# Patient Record
Sex: Female | Born: 1980
Health system: Southern US, Community
[De-identification: ages and names within clinical notes are randomized; demographics above are authoritative.]

## PROBLEM LIST (undated history)

## (undated) DIAGNOSIS — D649 Anemia, unspecified: Secondary | ICD-10-CM

## (undated) DIAGNOSIS — D259 Leiomyoma of uterus, unspecified: Secondary | ICD-10-CM

## (undated) HISTORY — PX: TUBAL LIGATION: SHX77

## (undated) HISTORY — DX: Anemia, unspecified: D64.9

---

## 2001-04-12 ENCOUNTER — Inpatient Hospital Stay (HOSPITAL_COMMUNITY): Admission: AD | Admit: 2001-04-12 | Discharge: 2001-04-12 | Payer: Self-pay | Admitting: Obstetrics & Gynecology

## 2007-03-09 ENCOUNTER — Other Ambulatory Visit: Admission: RE | Admit: 2007-03-09 | Discharge: 2007-03-09 | Payer: Self-pay | Admitting: Obstetrics & Gynecology

## 2007-05-06 ENCOUNTER — Ambulatory Visit (HOSPITAL_COMMUNITY): Admission: RE | Admit: 2007-05-06 | Discharge: 2007-05-06 | Payer: Self-pay | Admitting: Preventative Medicine

## 2008-07-25 ENCOUNTER — Ambulatory Visit (HOSPITAL_COMMUNITY): Admission: RE | Admit: 2008-07-25 | Discharge: 2008-07-25 | Payer: Self-pay | Admitting: Obstetrics & Gynecology

## 2008-08-04 ENCOUNTER — Other Ambulatory Visit: Admission: RE | Admit: 2008-08-04 | Discharge: 2008-08-04 | Payer: Self-pay | Admitting: Obstetrics & Gynecology

## 2008-08-22 ENCOUNTER — Ambulatory Visit (HOSPITAL_COMMUNITY): Admission: RE | Admit: 2008-08-22 | Discharge: 2008-08-22 | Payer: Self-pay | Admitting: Obstetrics & Gynecology

## 2008-10-24 ENCOUNTER — Ambulatory Visit (HOSPITAL_COMMUNITY): Admission: RE | Admit: 2008-10-24 | Discharge: 2008-10-24 | Payer: Self-pay | Admitting: Obstetrics & Gynecology

## 2008-11-01 ENCOUNTER — Ambulatory Visit (HOSPITAL_COMMUNITY): Admission: RE | Admit: 2008-11-01 | Discharge: 2008-11-01 | Payer: Self-pay | Admitting: Obstetrics & Gynecology

## 2008-11-09 ENCOUNTER — Ambulatory Visit (HOSPITAL_COMMUNITY): Admission: RE | Admit: 2008-11-09 | Discharge: 2008-11-09 | Payer: Self-pay | Admitting: Obstetrics & Gynecology

## 2008-12-13 ENCOUNTER — Inpatient Hospital Stay (HOSPITAL_COMMUNITY): Admission: AD | Admit: 2008-12-13 | Discharge: 2008-12-16 | Payer: Self-pay | Admitting: Obstetrics & Gynecology

## 2008-12-13 ENCOUNTER — Ambulatory Visit: Payer: Self-pay | Admitting: Obstetrics and Gynecology

## 2009-09-26 ENCOUNTER — Other Ambulatory Visit: Admission: RE | Admit: 2009-09-26 | Discharge: 2009-09-26 | Payer: Self-pay | Admitting: Obstetrics and Gynecology

## 2009-10-31 ENCOUNTER — Ambulatory Visit (HOSPITAL_COMMUNITY): Admission: RE | Admit: 2009-10-31 | Discharge: 2009-10-31 | Payer: Self-pay | Admitting: General Surgery

## 2010-06-23 ENCOUNTER — Encounter: Payer: Self-pay | Admitting: Obstetrics & Gynecology

## 2010-06-24 ENCOUNTER — Encounter: Payer: Self-pay | Admitting: Obstetrics & Gynecology

## 2010-08-19 LAB — BASIC METABOLIC PANEL
BUN: 13 mg/dL (ref 6–23)
Calcium: 9.4 mg/dL (ref 8.4–10.5)
Chloride: 105 mEq/L (ref 96–112)
Glucose, Bld: 105 mg/dL — ABNORMAL HIGH (ref 70–99)

## 2010-08-19 LAB — CBC
HCT: 34 % — ABNORMAL LOW (ref 36.0–46.0)
Hemoglobin: 10.7 g/dL — ABNORMAL LOW (ref 12.0–15.0)

## 2010-09-08 LAB — CBC
HCT: 29.3 % — ABNORMAL LOW (ref 36.0–46.0)
HCT: 33.8 % — ABNORMAL LOW (ref 36.0–46.0)
MCHC: 33.7 g/dL (ref 30.0–36.0)
MCHC: 33.2 g/dL (ref 30.0–36.0)
MCV: 79.5 fL (ref 78.0–100.0)
MCV: 77.9 fL — ABNORMAL LOW (ref 78.0–100.0)
Platelets: 205 10*3/uL (ref 150–400)
RBC: 4.35 MIL/uL (ref 3.87–5.11)
RDW: 15 % (ref 11.5–15.5)
WBC: 13.4 10*3/uL — ABNORMAL HIGH (ref 4.0–10.5)

## 2010-09-08 LAB — GLUCOSE, CAPILLARY: Glucose-Capillary: 114 mg/dL — ABNORMAL HIGH (ref 70–99)

## 2010-10-15 NOTE — H&P (Signed)
[name], [name] NO.:  [id number]   MEDICAL RECORD NO.:  [id number]          PATIENT TYPE:  INP   LOCATION:  9162                          FACILITY:  [facility]   PHYSICIAN:  Tilda Burrow, M.D. DATE OF BIRTH:  [birth year]   DATE OF ADMISSION:  12/13/2008  DATE OF DISCHARGE:                              HISTORY & PHYSICAL   ADMITTING DIAGNOSES:  Pregnancy 40 weeks 5 days, induction of labor,  borderline low normal amniotic fluid index 9.8, history of intrauterine  growth retardation infant with previous pregnancy, cervical friability,  history of rapid labor.   HISTORY OF PRESENT ILLNESS:  This 30 year old female gravida 3, para 1-0-  1-1, LMP April 02, 2008, placing menstrual EDC, January 07, 2009, with  ultrasound corrected [facility] of December 08, 2008, based on 18-week ultrasound  with progressively large infant by serial ultrasounds.  Infant has had  estimated weight of 4073 g on December 05, 2008, based on ultrasound at  [facility] with AFI of 9.17.  Cervix has been found to be  favorable at 2 cm, 50%, -2 at last office visit, December 08, 2008, and was  seen by Dr. Despina Hidden.  She is scheduled for induction of labor at this time.  Prenatal course has been followed through [facility] since  July 13, 2008, with pregnancy notable for blood type O+, urine drug  screen negative, hemoglobin 10, hematocrit 32, platelets 273,000.  Hepatitis, HIV, RPR, GC, and chlamydia all negative.  Pap smear class I.  She has a history of abnormal Paps in the past.  MSAFP was abnormal with  increased risk of open spina bifida at 1 and 14.  She has had two  ultrasounds at [facility], which showed no visible  abnormalities.  Amniocentesis was not performed.  Femur length was at  the 7th percentile and serial ultrasounds to rule out IUGR where  recommended and followed.  The infant is now large for gestational age  of 32 g.  Glucose tolerance test was 150 mg  percent at 28 weeks with 3-  hour glucose tolerance test grossly normal.  She had no episodes of  glucosuria and was not monitoring blood sugars.  She desires postpartum  contraception, tubal ligation, and desires in-house tubal ligation if  possible.  She plans to breast-feeding with bottle supplementation, and  we will take the baby to Dr. Phillips Odor.   PAST MEDICAL HISTORY:  Benign.   SURGICAL HISTORY:  Nose surgery in 1998, breast biopsy in 2002, dental  surgery in 2000.  Cigarettes, alcohol, or recreational drugs are denied.   ALLERGIES:  She has no allergies.   FAMILY HISTORY:  Positive for hypertension, diabetes, stroke.   GYN HISTORY:  Notable for abnormal Pap smear.   FAMILY GENETIC HISTORY:  Positive for a maternal aunt's first daughter  who was reportedly born without limbs.  She had a maternal uncle whose  family involved in an anencephalic child.  As stated earlier, the  ultrasounds on this infant have been normal [facility].   PHYSICAL EXAMINATION:  Reveals a healthy-appearing female, weight  175,  blood pressure 120/70, fundal height 39 cm, estimated fetal weight by  ultrasound 4073 g.  Cervix 2 cm, 50%, -2 on December 08, 2008.  Group B strep  is negative.  Repeat GC and chlamydia negative.   ASSESSMENT:  Pregnancy 40 weeks 7 days, cervical friability, large for  gestational age infant, history of prior intrauterine growth retardation  infant scheduled for induction of labor December 13, 2008.      Tilda Burrow, M.D.  Electronically Signed     JVF/MEDQ  D:  12/13/2008  T:  12/14/2008  Job:  [id number]   cc:   [facility]   Dr. Phillips Odor.

## 2010-10-15 NOTE — Op Note (Signed)
[name], [name]              ACCOUNT NO.:  [id number]   MEDICAL RECORD NO.:  [id number]           PATIENT TYPE:   LOCATION:                                 FACILITY:   PHYSICIAN:  Allie Bossier, MD        DATE OF BIRTH:  1981/01/06   DATE OF PROCEDURE:  DATE OF DISCHARGE:                               OPERATIVE REPORT   PREOPERATIVE DIAGNOSIS:  Multiparity, desires sterility.   POSTOPERATIVE DIAGNOSIS:  Multiparity, desires sterility.   PROCEDURE:  Bilateral tubal sterilization procedure with application of  Filshie clip.   SURGEON:  Allie Bossier, MD   ANESTHESIA:  LMA.   COMPLICATIONS:  None.   ESTIMATED BLOOD LOSS:  Minimal.   SPECIMENS:  None.   FINDINGS:  Normal-appearing oviducts.   DETAILED PROCEDURE AND FINDINGS:  The risks, benefits, alternatives and  0.5% failure rate were explained, understood, and accepted.  Consents  were signed.  She declines alternative forms of birth control.  She was  taken to the operating room.  General anesthesia was applied without  complication.  Her abdomen was prepped and draped in usual sterile  fashion.  A Foley catheter was placed.  Clear urine was drained  throughout.  A horizontal umbilical incision was made.  The fascia was  elevated with Kocher clamps and incised with curved Mayo scissors.  Peritoneum was entered with hemostats.  Using [employer] retractors, I  was able to visualize the right oviduct.  I grasped with the Babcock  clamp and traced it to its fimbriated end.  It was then retraced at the  isthmic region where a Filshie clip was placed across the entire oviduct  in a perpendicular fashion approximately 1 mL of 0.5% Marcaine was  injected just distal to the Filshie clip for postop pain relief.  No  bleeding was noted.  A repeat procedure was performed on the other side.  Again, note that the fimbria was visualized and the Filshie clip was  placed in the isthmic region.  The tubes were allowed to fall back in  the abdominal cavity.  The fascia was grasped with Allis clamps and  elevated.  It was then closed with a 0 Vicryl running nonlocking suture.  No defects were palpable.  The  subcutaneous tissue was infiltrated with approximately 7 mL of 0.5%  Marcaine.  A subcuticular closure was done with 4-0 Vicryl suture.  Instrument, sponge, needle counts were correct.  The patient was  extubated and taken to recovery room in stable condition.  She tolerated  the procedure well.      Allie Bossier, MD  Electronically Signed     MCD/MEDQ  D:  12/15/2008  T:  12/16/2008  Job:  [id number]

## 2010-10-15 NOTE — Discharge Summary (Signed)
[name], [name]              ACCOUNT NO.:  [id number]   MEDICAL RECORD NO.:  [id number]          PATIENT TYPE:  INP   LOCATION:  9104                          FACILITY:  [facility]   PHYSICIAN:  Tilda Burrow, M.D. DATE OF BIRTH:  [birth year]   DATE OF ADMISSION:  12/13/2008  DATE OF DISCHARGE:                               DISCHARGE SUMMARY   ADMISSION DIAGNOSES:  1. Intrauterine pregnancy at 40 weeks and 5 days.  2. Borderline low amniotic fluid.  3. History of intrauterine growth retardation with previous pregnancy.  4. Cervical friability  5. History of rapid labor.  6. Fetal macrosomia.   DISCHARGE DIAGNOSES:  1. Intrauterine pregnancy of 40 weeks and 5 days.  2. Borderline low amniotic fluid.  3. History of intrauterine growth retardation with previous pregnancy.  4. Cervical friability  5. History of rapid labor.  6. Fetal macrosomia.  7. Spontaneous vaginal delivery of a viable female infant weighing 9      pounds 3 ounces and also another diagnosis of second degree      perineal laceration and labial laceration.  8. Desires elective sterilization.  9. Status post postpartum bilateral tubal ligation.   HOSPITAL PROCEDURES:  1. Electronic fetal monitoring.  2. Internal fetal monitoring.  3. Pitocin induction of labor.  4. Epidural anesthesia.  5. Spontaneous vaginal delivery.  6. Repair of vaginal and labial lacerations.  7. Elective postpartum bilateral tubal ligation with Filshie clip.   HOSPITAL COURSE:  The patient was admitted for induction of labor.  Pitocin was started and a Foley bulb was placed for cervical ripening.  Later that morning she progressed to 5 cm.  Foley bulb came out and  epidural was placed for pain control.  Pitocin was continued.  She  progressed throughout the morning to spontaneous vaginal delivery of a  female weighing 9 pounds 3 ounces.  No difficulty with shoulders.  Vaginal and labial lacerations were repaired and on December 15, 2008  she  had an elective postpartum bilateral tubal ligation with Filshie clips  without incident and was doing well with her bleeding and tolerating  regular diet on postpartum day #2 and postop day #1.  She was ready to  go home.  She was breastfeeding well.  Vital signs were stable.  Blood  pressure 122/78, O2 saturations 97-98%.  Chest was clear.  Heart rate  regular rate and rhythm.  Abdomen: Soft and appropriately tender.  Incision was clean, dry and intact with dressing, having small old  drainage out.  Lochia was within normal limits.  Perineum was healing.  Extremities within normal limits.  She was deemed to have received full  benefit of her hospital stay and was discharged to home.   CONDITION ON DISCHARGE:  Good.   DISCHARGE LABORATORY STUDIES:  White blood cell count 13.4, hemoglobin  9.9, and platelets 187.  RPR nonreactive.   DISCHARGE MEDICATIONS:  1. Percocet 1-2 p.o. q.4 h. p.r.n.  2. Motrin 600 mg p.o. q.6 h. p.r.n.   DISCHARGE INSTRUCTIONS:  Per handout.   DISCHARGE FOLLOWUP:  Discharge followup in 6 weeks at [facility]  or  p.r.n. as indicated.      [name], C.N.M.      Tilda Burrow, M.D.  Electronically Signed    MLW/MEDQ  D:  12/16/2008  T:  12/16/2008  Job:  [id number]

## 2013-03-25 ENCOUNTER — Ambulatory Visit (INDEPENDENT_AMBULATORY_CARE_PROVIDER_SITE_OTHER): Payer: Medicare HMO

## 2013-03-25 DIAGNOSIS — Z23 Encounter for immunization: Secondary | ICD-10-CM

## 2013-12-31 ENCOUNTER — Encounter: Payer: Self-pay | Admitting: *Deleted

## 2014-03-30 ENCOUNTER — Ambulatory Visit (INDEPENDENT_AMBULATORY_CARE_PROVIDER_SITE_OTHER): Payer: Medicare HMO

## 2014-03-30 DIAGNOSIS — Z23 Encounter for immunization: Secondary | ICD-10-CM

## 2014-04-26 ENCOUNTER — Encounter: Payer: Self-pay | Admitting: Obstetrics & Gynecology

## 2014-04-26 ENCOUNTER — Ambulatory Visit (INDEPENDENT_AMBULATORY_CARE_PROVIDER_SITE_OTHER): Payer: Managed Care, Other (non HMO) | Admitting: Obstetrics & Gynecology

## 2014-04-26 ENCOUNTER — Other Ambulatory Visit: Payer: Self-pay | Admitting: Obstetrics & Gynecology

## 2014-04-26 VITALS — BP 120/80 | Ht 67.0 in | Wt 174.0 lb

## 2014-04-26 DIAGNOSIS — N939 Abnormal uterine and vaginal bleeding, unspecified: Secondary | ICD-10-CM | POA: Diagnosis not present

## 2014-04-26 DIAGNOSIS — D62 Acute posthemorrhagic anemia: Secondary | ICD-10-CM

## 2014-04-26 DIAGNOSIS — D250 Submucous leiomyoma of uterus: Secondary | ICD-10-CM

## 2014-04-26 DIAGNOSIS — D259 Leiomyoma of uterus, unspecified: Secondary | ICD-10-CM

## 2014-04-26 LAB — POCT HEMOGLOBIN: Hemoglobin: 9.1 g/dL — AB (ref 12.2–16.2)

## 2014-04-26 NOTE — Progress Notes (Signed)
Patient ID: [name], female   DOB: [birth year], 33 y.o.   MRN: [id number] Chief Complaint  Patient presents with  . work-in-gyn visit    heavy vaginal bleeding.    HPI Has been having heavy vaginal bleeding for about a week, soiling sheets and clothes, never ever bled like this before  ROS   Blood pressure 120/80, height 5\' 7"  (1.702 m), weight 174 lb (78.926 kg), last menstrual period 04/20/2014.  EXAM Abdomen:      soft, nontender, nondistended Vulva:            normal appearing vulva with no masses, tenderness or lesions Vagina:          Heavy bleeding and mass prolapsing through the cervix Cervix:           mass, prolapsed fibroid 3 x 3 cm Uterus:           Adnexa:          Rectal:            Hemoccult:                           Fibroid was removed with ring forceps and twisting about  Revolutions and it was removed bleeding immediately stopped     Assessment/Plan:  Prolapsed fibroid causing heavy vaginal bleeding :removed Follow up next week  Endometrial Biopsy Procedure Note  Pre-operative Diagnosis: prolapsed fibroid  Post-operative Diagnosis: same  Indications: menometrorrhagia   Procedure Details   Urine pregnancy test was not done.  The risks (including infection, bleeding, pain, and uterine perforation) and benefits of the procedure were explained to the patient and Verbal informed consent was obtained.  Antibiotic prophylaxis against endocarditis was not indicated.   The patient was placed in the dorsal lithotomy position.  Bimanual exam showed the uterus to be in the neutral position.  A Graves' speculum inserted in the vagina, and the cervix prepped with povidone iodine.  Endocervical curettage with a Kevorkian curette was not performed.   A sharp tenaculum was applied to the anterior lip of the cervix for stabilization.  The fibroid was grasped with a ring forceps and removed with 4 revolutions of twisting.  Sample was sent for pathologic  examination.  Condition: Stable  Complications: None  Plan:  The patient was advised to call for any fever or for prolonged or severe pain or bleeding. She was advised to use OTC ibuprofen as needed for mild to moderate pain. She was advised to avoid vaginal intercourse for 48 hours or until the bleeding has completely stopped.

## 2014-04-30 ENCOUNTER — Encounter (HOSPITAL_COMMUNITY): Payer: Self-pay | Admitting: Emergency Medicine

## 2014-04-30 ENCOUNTER — Inpatient Hospital Stay (HOSPITAL_COMMUNITY)
Admission: EM | Admit: 2014-04-30 | Discharge: 2014-05-01 | DRG: 743 | Disposition: A | Discharge status: Home / Self Care | Payer: Managed Care, Other (non HMO) | Attending: Family Medicine | Admitting: Family Medicine

## 2014-04-30 DIAGNOSIS — I959 Hypotension, unspecified: Secondary | ICD-10-CM

## 2014-04-30 DIAGNOSIS — I951 Orthostatic hypotension: Secondary | ICD-10-CM | POA: Diagnosis present

## 2014-04-30 DIAGNOSIS — D500 Iron deficiency anemia secondary to blood loss (chronic): Secondary | ICD-10-CM | POA: Diagnosis not present

## 2014-04-30 DIAGNOSIS — Y9289 Other specified places as the place of occurrence of the external cause: Secondary | ICD-10-CM

## 2014-04-30 DIAGNOSIS — N840 Polyp of corpus uteri: Secondary | ICD-10-CM | POA: Diagnosis not present

## 2014-04-30 DIAGNOSIS — Z9851 Tubal ligation status: Secondary | ICD-10-CM

## 2014-04-30 DIAGNOSIS — R55 Syncope and collapse: Secondary | ICD-10-CM

## 2014-04-30 DIAGNOSIS — N920 Excessive and frequent menstruation with regular cycle: Secondary | ICD-10-CM | POA: Diagnosis not present

## 2014-04-30 DIAGNOSIS — N946 Dysmenorrhea, unspecified: Secondary | ICD-10-CM | POA: Diagnosis present

## 2014-04-30 DIAGNOSIS — D649 Anemia, unspecified: Secondary | ICD-10-CM | POA: Diagnosis present

## 2014-04-30 DIAGNOSIS — N939 Abnormal uterine and vaginal bleeding, unspecified: Secondary | ICD-10-CM | POA: Diagnosis present

## 2014-04-30 HISTORY — DX: Leiomyoma of uterus, unspecified: D25.9

## 2014-04-30 LAB — PROTIME-INR
INR: 1.02 (ref 0.00–1.49)
Prothrombin Time: 13.6 seconds (ref 11.6–15.2)

## 2014-04-30 LAB — WET PREP, GENITAL
Clue Cells Wet Prep HPF POC: NONE SEEN
Trich, Wet Prep: NONE SEEN
WBC, Wet Prep HPF POC: NONE SEEN
YEAST WET PREP: NONE SEEN

## 2014-04-30 LAB — CBC WITH DIFFERENTIAL/PLATELET
BASOS ABS: 0 10*3/uL (ref 0.0–0.1)
BASOS PCT: 0 % (ref 0–1)
EOS PCT: 2 % (ref 0–5)
Eosinophils Absolute: 0.2 10*3/uL (ref 0.0–0.7)
HEMATOCRIT: 24.1 % — AB (ref 36.0–46.0)
Hemoglobin: 7.9 g/dL — ABNORMAL LOW (ref 12.0–15.0)
Lymphocytes Relative: 31 % (ref 12–46)
Lymphs Abs: 3 10*3/uL (ref 0.7–4.0)
MCH: 24.3 pg — ABNORMAL LOW (ref 26.0–34.0)
MCHC: 32.8 g/dL (ref 30.0–36.0)
MCV: 74.2 fL — AB (ref 78.0–100.0)
MONO ABS: 0.3 10*3/uL (ref 0.1–1.0)
Monocytes Relative: 3 % (ref 3–12)
Neutro Abs: 6.2 10*3/uL (ref 1.7–7.7)
Neutrophils Relative %: 64 % (ref 43–77)
PLATELETS: 319 10*3/uL (ref 150–400)
RBC: 3.25 MIL/uL — ABNORMAL LOW (ref 3.87–5.11)
RDW: 13.5 % (ref 11.5–15.5)
WBC: 9.7 10*3/uL (ref 4.0–10.5)

## 2014-04-30 LAB — URINALYSIS, ROUTINE W REFLEX MICROSCOPIC
Bilirubin Urine: NEGATIVE
Glucose, UA: NEGATIVE mg/dL
Ketones, ur: NEGATIVE mg/dL
LEUKOCYTES UA: NEGATIVE
NITRITE: NEGATIVE
PH: 7 (ref 5.0–8.0)
Urobilinogen, UA: 0.2 mg/dL (ref 0.0–1.0)

## 2014-04-30 LAB — BASIC METABOLIC PANEL
ANION GAP: 10 (ref 5–15)
BUN: 11 mg/dL (ref 6–23)
CALCIUM: 8.4 mg/dL (ref 8.4–10.5)
CO2: 28 mEq/L (ref 19–32)
CREATININE: 0.71 mg/dL (ref 0.50–1.10)
Chloride: 103 mEq/L (ref 96–112)
GFR calc Af Amer: >90 mL/min (ref >90)
Glucose, Bld: 153 mg/dL — ABNORMAL HIGH (ref 70–99)
Potassium: 3.3 mEq/L — ABNORMAL LOW (ref 3.7–5.3)
Sodium: 141 mEq/L (ref 137–147)

## 2014-04-30 LAB — I-STAT BETA HCG BLOOD, ED (MC, WL, AP ONLY)

## 2014-04-30 LAB — ABO/RH: ABO/RH(D): O POS

## 2014-04-30 LAB — URINE MICROSCOPIC-ADD ON

## 2014-04-30 LAB — PREPARE RBC (CROSSMATCH)

## 2014-04-30 LAB — APTT: aPTT: 29 seconds (ref 24–37)

## 2014-04-30 MED ORDER — ACETAMINOPHEN 325 MG PO TABS
650.0000 mg | ORAL_TABLET | Freq: Four times a day (QID) | ORAL | Status: DC | PRN
  Administered 2014-05-01: 650 mg via ORAL
  Filled 2014-04-30: qty 2

## 2014-04-30 MED ORDER — ONDANSETRON HCL 4 MG/2ML IJ SOLN
4.0000 mg | Freq: Once | INTRAMUSCULAR | Status: AC
  Administered 2014-04-30: 4 mg via INTRAVENOUS
  Filled 2014-04-30: qty 2

## 2014-04-30 MED ORDER — FENTANYL CITRATE 0.05 MG/ML IJ SOLN
50.0000 ug | Freq: Once | INTRAMUSCULAR | Status: AC
  Administered 2014-04-30: 50 ug via INTRAVENOUS
  Filled 2014-04-30: qty 2

## 2014-04-30 MED ORDER — ONDANSETRON HCL 4 MG/2ML IJ SOLN: 4.0000 mg | Freq: Four times a day (QID) | INTRAMUSCULAR | Status: DC | PRN

## 2014-04-30 MED ORDER — ONDANSETRON HCL 4 MG PO TABS: 4.0000 mg | ORAL_TABLET | Freq: Four times a day (QID) | ORAL | Status: DC | PRN

## 2014-04-30 MED ORDER — MEGESTROL ACETATE 40 MG PO TABS
ORAL_TABLET | ORAL | Status: AC
  Filled 2014-04-30: qty 3

## 2014-04-30 MED ORDER — MEGESTROL ACETATE 40 MG PO TABS
120.0000 mg | ORAL_TABLET | Freq: Every day | ORAL | Status: DC
  Administered 2014-04-30 – 2014-05-01 (×2): 120 mg via ORAL
  Filled 2014-04-30 (×5): qty 3

## 2014-04-30 MED ORDER — SODIUM CHLORIDE 0.9 % IV SOLN
INTRAVENOUS | Status: DC
  Administered 2014-05-01: via INTRAVENOUS

## 2014-04-30 MED ORDER — ACETAMINOPHEN 650 MG RE SUPP: 650.0000 mg | Freq: Four times a day (QID) | RECTAL | Status: DC | PRN

## 2014-04-30 MED ORDER — SODIUM CHLORIDE 0.9 % IV BOLUS (SEPSIS)
1000.0000 mL | Freq: Once | INTRAVENOUS | Status: AC
  Administered 2014-04-30: 1000 mL via INTRAVENOUS

## 2014-04-30 MED ORDER — SODIUM CHLORIDE 0.9 % IV SOLN: 10.0000 mL/h | Freq: Once | INTRAVENOUS | Status: DC

## 2014-04-30 MED ORDER — MORPHINE SULFATE 2 MG/ML IJ SOLN: 1.0000 mg | INTRAMUSCULAR | Status: DC | PRN

## 2014-04-30 NOTE — Plan of Care (Signed)
Problem: Phase I Progression Outcomes Goal: Pain controlled with appropriate interventions Outcome: Progressing Goal: Voiding-avoid urinary catheter unless indicated Outcome: Progressing Goal: Hemodynamically stable Outcome: Progressing

## 2014-04-30 NOTE — H&P (Signed)
[facility] History and Physical  MORNINGSTAR TOFT WLN:[id number] DOB: 1981-03-28 DOA: 04/30/2014  Referring physician: ER physician. PCP: Rubbie Battiest, MD   Chief Complaint: Vaginal bleeding.  HPI: [name] is a 33 y.o. female presents to the ER because of continuous vaginal bleeding. Patient has been having this symptom from last 11 days. Patient had gone to gynecologist Dr. Elonda Husky last Wednesday and at that time was found to have a pedunculated fibroid which was removed in the office. At the time bleeding was controlled but started having recurrent bleeding. Patient also had 2 brief syncopal episodes today. Patient's gynecologist Dr. Elonda Husky was consulted by the ER physician and at this time Dr. Elonda Husky has advised transfusion of PRBC and Megace. To admit patient to [facility] and Dr. Elonda Husky will be seeing patient in consult. Patient is orthostatic on standing.   Review of Systems: As presented in the history of presenting illness, rest negative.  Past Medical History  Diagnosis Date  . Anemia   . Uterine fibroid    Past Surgical History  Procedure Laterality Date  . Tubal ligation     Social History:  reports that she has never smoked. She does not have any smokeless tobacco history on file. She reports that she does not drink alcohol. Her drug history is not on file. Where does patient live home. Can patient participate in ADLs? Yes.  No Known Allergies  Family History:  Family History  Problem Relation Age of Onset  . Anuerysm Other       Prior to Admission medications   Medication Sig Start Date End Date Taking? Authorizing Provider  ferrous sulfate 325 (65 FE) MG EC tablet Take 325 mg by mouth daily.   Yes Historical Provider, MD  ibuprofen (ADVIL,MOTRIN) 200 MG tablet Take 400 mg by mouth daily as needed for moderate pain.   Yes Historical Provider, MD    Physical Exam: Filed Vitals:   04/30/14 2115 04/30/14 2145 04/30/14 2200 04/30/14 2215  BP: 98/61  114/67 119/69 110/73  Pulse: 81 84 88 88  Temp:    98.3 F (36.8 C)  TempSrc:    Oral  Resp: 24 21 22 20   Height:      Weight:      SpO2: 96% 100% 100% 100%     General:  Well-developed and nourished.  Eyes: Anicteric mild pallor.  ENT: No discharge from the ears eyes nose mouth.  Neck: No mass felt.  Cardiovascular: S1 and S2 heard.  Respiratory: No rhonchi or crepitations.  Abdomen: Soft nontender bowel sounds present.  Skin: No rash.  Musculoskeletal: No edema.  Psychiatric: Appears normal.  Neurologic: Alert awake oriented to time place and person. Moves all extremities.  Labs on Admission:  Basic Metabolic Panel:  Recent Labs Lab 04/30/14 2031  NA 141  K 3.3*  CL 103  CO2 28  GLUCOSE 153*  BUN 11  CREATININE 0.71  CALCIUM 8.4   Liver Function Tests: No results for input(s): AST, ALT, ALKPHOS, BILITOT, PROT, ALBUMIN in the last 168 hours. No results for input(s): LIPASE, AMYLASE in the last 168 hours. No results for input(s): AMMONIA in the last 168 hours. CBC:  Recent Labs Lab 04/26/14 1226 04/30/14 2031  WBC  --  9.7  NEUTROABS  --  6.2  HGB 9.1* 7.9*  HCT  --  24.1*  MCV  --  74.2*  PLT  --  319   Cardiac Enzymes: No results for input(s): CKTOTAL, CKMB, CKMBINDEX,  TROPONINI in the last 168 hours.  BNP (last 3 results) No results for input(s): PROBNP in the last 8760 hours. CBG: No results for input(s): GLUCAP in the last 168 hours.  Radiological Exams on Admission: No results found.  EKG: Independently reviewed. Normal sinus rhythm.  Assessment/Plan Principal Problem:   Vaginal bleeding Active Problems:   Anemia   Syncope   1. Severe vaginal bleeding - I have discussed with Dr. Elonda Husky on call gynecologist and known to the patient. At this time Dr. Elonda Husky as advised to keep patient nothing by mouth and will be seeing patient in consult and we will be continuing with 2 units of PRBC transfusion place and has been already placed  on the case and pelvic ultrasound has been ordered. Further recommendations per Dr. Elonda Husky. 2. Symptomatic anemia with cocaine orthostatic hypotension secondary to vaginal bleeding - follow CBC continue IV hydration.    Code Status: Full code.  Family Communication: None.  Disposition Plan: Admit to inpatient.    Pearly Apachito N. [facility] Pager [phone].  If 7PM-7AM, please contact night-coverage [email or web address] Password [email or web address] 04/30/2014, 10:26 PM

## 2014-04-30 NOTE — ED Notes (Signed)
Having heavy periods for the past 2 weeks. Still bleeding. Pt states changing pads every 20 to 30 minutes pads today. Pt passed out when trying to change pad tonight.

## 2014-04-30 NOTE — ED Notes (Signed)
Pt arrived to registration window stating she has had been bleeding for 3 months. Pt states she was feeling like she was going to pass out. Pt then while holding her husbands arm slid down to the floor. Pt did not hit head. Pt was sent to triage per charge nurse. Pt also states she had a fibroid removed on Wednesday by Dr. Elonda Husky.

## 2014-04-30 NOTE — ED Notes (Signed)
This is my third month of heavy vaginal bleeding and my periods are getting longer. I blacked out in my bathroom at home. I was seen by the OBGYN this past week and he removed a fibroid and placed some medication in my uterus. Having clots and I don't feel well per pt.

## 2014-04-30 NOTE — ED Provider Notes (Addendum)
TIME SEEN: This chart was scribed for [place], DO by Hilda Lias, ED Scribe. This patient was seen in room APA19/APA19 and the patient's care was started at 8:38 PM.   CHIEF COMPLAINT: Vaginal Bleeding  HPI:  [name] is a 33 y.o. female with Anemia who presents to the Emergency Department complaining of vaginal bleeding with associated nausea and SOB that has been worsening over the past several days. Pt states that she has had a heavy menstrual period for the past three months. Pt states that her menstrual cycle has lasted about two weeks each time for the past three months (13 days in September, 14 days in October and she is now on day 11 in November). Pt states that she passed out earlier today in the bathroom and almost passed out while waiting to be seen in the ED. Pt states that she had an endometrial biopsy with Dr.  Elonda Husky 04/26/14.  She states she had medication placed in her vagina to help with her bleeding but she states it has not improved. She reports she has been changing her pads and tampons every 20-30 minutes. Pt states that clots have been coming out, and when they do, they are the size of a golf ball. Pt had tubal ligation in 2000. G/P/A: 3/1/2. Pt denies having similar episodes prior to the episodes the past three months. Pt denies having a blood transfusion before. Pt denies dysuria, hematuria, vaginal bleeding vomiting, and chest pain.     ROS: See HPI Constitutional: no fever  Eyes: no drainage  ENT: no runny nose   Cardiovascular:  no chest pain  Resp: SOB, no chest pain GI: no vomiting GU: no dysuria Integumentary: no rash  Allergy: no hives  Musculoskeletal: no leg swelling  Neurological: no slurred speech ROS otherwise negative  PAST MEDICAL HISTORY/PAST SURGICAL HISTORY:  Past Medical History  Diagnosis Date  . Anemia   . Uterine fibroid     MEDICATIONS:  Prior to Admission medications   Not on File    ALLERGIES:  No Known  Allergies  SOCIAL HISTORY:  History  Substance Use Topics  . Smoking status: Never Smoker   . Smokeless tobacco: Not on file  . Alcohol Use: No    FAMILY HISTORY: No family history on file.  EXAM: BP 80/41 mmHg  Pulse 77  Temp(Src) 97.8 F (36.6 C) (Oral)  Resp 24  Ht 5\' 7"  (1.702 m)  Wt 174 lb (78.926 kg)  BMI 27.25 kg/m2  SpO2 99%  LMP 04/20/2014 CONSTITUTIONAL: Alert and oriented and responds appropriately to questions. Appears uncomfortable but nontoxic, no apparent distress HEAD: Normocephalic EYES: Conjunctivae clear, PERRL; no conjunctival pallor ENT: normal nose; no rhinorrhea; moist mucous membranes; pharynx without lesions noted NECK: Supple, no meningismus, no LAD  CARD: RRR; S1 and S2 appreciated; no murmurs, no clicks, no rubs, no gallops RESP: Normal chest excursion without splinting or tachypnea; breath sounds clear and equal bilaterally; no wheezes, no rhonchi, no rales,  ABD/GI: Diffusely tender throughout the lower abdomen; no rebound, no guarding, no peritoneal signs, normal bowel sounds GU:  Large amount of bright red blood coming from the cervical os, patient is diffusely tender on pelvic exam with bilateral adnexal tenderness and cervical motion tenderness, no vaginal discharge appreciated BACK:  The back appears normal and is non-tender to palpation, there is no CVA tenderness EXT: Normal ROM in all joints; non-tender to palpation; no edema; normal capillary refill; no cyanosis    SKIN: Normal  color for age and race; warm NEURO: Moves all extremities equally PSYCH: The patient's mood and manner are appropriate. Grooming and personal hygiene are appropriate.    MEDICAL DECISION MAKING: Pt here with heavy vaginal bleeding. She has a blood pressure of 80/41. Hemoglobin 4 days ago was 9.1. Concern for acute blood loss anemia. Will bolus IV fluids. Will check labs, type and screen. EKG is nonischemic. We'll discuss with Dr. Elonda Husky.  ED PROGRESS: 9:38 PM  Pt  had a near syncopal event was standing up to remove her underwear. She passed a softball size clot from her vagina. Blood pressure is 77/65. Hemoglobin is 7.9 down from 9.14 days ago. We'll give 2 units of packed red blood cells. We'll consult Dr. Elonda Husky.  9:43 PM  Spoke with Dr. Elonda Husky.  He recommends giving the patient 120 mg of Megace daily. We'll give first dose now. He recommends ordering a pelvic ultrasound for tomorrow morning. He will see the patient in consult tomorrow. He recommends admission to medicine and feel she can stay at [facility] as he would not perform any surgical intervention tonight.  He states he did remove a prolapsed pedunculated fibroid on 04/26/14 and feels this is likely the cause of her bleeding. We'll discuss with hospitalist.   10:00 PM  D/w Dr. Hal Hope for admission to stepdown.  Ultrasound ordered for tomorrow morning.    EKG Interpretation  Date/Time:  Sunday April 30 2014 20:46:49 EST Ventricular Rate:  87 PR Interval:  163 QRS Duration: 79 QT Interval:  383 QTC Calculation: 461 R Axis:   66 Text Interpretation:  Sinus rhythm No old tracing to compare Confirmed by [name],  DO, [name] ([id number]) on 04/30/2014 8:50:16 PM       CRITICAL CARE Performed by: Nyra Jabs   Total critical care time: 45 minutes  Critical care time was exclusive of separately billable procedures and treating other patients.  Critical care was necessary to treat or prevent imminent or life-threatening deterioration.  Critical care was time spent personally by me on the following activities: development of treatment plan with patient and/or surrogate as well as nursing, discussions with consultants, evaluation of patient's response to treatment, examination of patient, obtaining history from patient or surrogate, ordering and performing treatments and interventions, ordering and review of laboratory studies, ordering and review of radiographic studies, pulse oximetry and  re-evaluation of patient's condition.    I personally performed the services described in this documentation, which was scribed in my presence. The recorded information has been reviewed and is accurate.    [place], DO 04/30/14 2147  [place], DO 04/30/14 2200

## 2014-04-30 NOTE — ED Notes (Signed)
Pt attempted to walk to the restroom. Pt ok going to the restroom but became lightheaded & felt like she was going to pass out when she started to return. Pt hypotensive when returned to the room. Pt placed in trendelenburg position & began to feel better.

## 2014-05-01 ENCOUNTER — Inpatient Hospital Stay (HOSPITAL_COMMUNITY): Payer: Managed Care, Other (non HMO) | Admitting: Anesthesiology

## 2014-05-01 ENCOUNTER — Other Ambulatory Visit (HOSPITAL_COMMUNITY): Payer: Managed Care, Other (non HMO)

## 2014-05-01 ENCOUNTER — Inpatient Hospital Stay (HOSPITAL_COMMUNITY): Payer: Managed Care, Other (non HMO)

## 2014-05-01 ENCOUNTER — Encounter (HOSPITAL_COMMUNITY)
Admission: EM | Disposition: A | Discharge status: Home / Self Care | Payer: Self-pay | Source: Home / Self Care | Attending: Family Medicine

## 2014-05-01 ENCOUNTER — Encounter (HOSPITAL_COMMUNITY): Payer: Self-pay | Admitting: *Deleted

## 2014-05-01 HISTORY — PX: DILITATION & CURRETTAGE/HYSTROSCOPY WITH THERMACHOICE ABLATION: SHX5569

## 2014-05-01 LAB — CBC WITH DIFFERENTIAL/PLATELET
BASOS ABS: 0 10*3/uL (ref 0.0–0.1)
BASOS PCT: 0 % (ref 0–1)
EOS ABS: 0.1 10*3/uL (ref 0.0–0.7)
EOS PCT: 1 % (ref 0–5)
HEMATOCRIT: 25.7 % — AB (ref 36.0–46.0)
Hemoglobin: 8.7 g/dL — ABNORMAL LOW (ref 12.0–15.0)
LYMPHS PCT: 20 % (ref 12–46)
Lymphs Abs: 1.8 10*3/uL (ref 0.7–4.0)
MCH: 26.2 pg (ref 26.0–34.0)
MCHC: 33.9 g/dL (ref 30.0–36.0)
MCV: 77.4 fL — AB (ref 78.0–100.0)
MONO ABS: 0.4 10*3/uL (ref 0.1–1.0)
Monocytes Relative: 5 % (ref 3–12)
Neutro Abs: 6.7 10*3/uL (ref 1.7–7.7)
Neutrophils Relative %: 75 % (ref 43–77)
Platelets: 223 10*3/uL (ref 150–400)
RBC: 3.32 MIL/uL — ABNORMAL LOW (ref 3.87–5.11)
RDW: 14.9 % (ref 11.5–15.5)
WBC: 8.9 10*3/uL (ref 4.0–10.5)

## 2014-05-01 LAB — COMPREHENSIVE METABOLIC PANEL
ALBUMIN: 3 g/dL — AB (ref 3.5–5.2)
ALT: 17 U/L (ref 0–35)
AST: 19 U/L (ref 0–37)
Alkaline Phosphatase: 43 U/L (ref 39–117)
Anion gap: 10 (ref 5–15)
BUN: 9 mg/dL (ref 6–23)
CHLORIDE: 108 meq/L (ref 96–112)
CO2: 25 mEq/L (ref 19–32)
Calcium: 8.3 mg/dL — ABNORMAL LOW (ref 8.4–10.5)
Creatinine, Ser: 0.58 mg/dL (ref 0.50–1.10)
GFR calc Af Amer: >90 mL/min (ref >90)
Glucose, Bld: 111 mg/dL — ABNORMAL HIGH (ref 70–99)
POTASSIUM: 3.9 meq/L (ref 3.7–5.3)
Sodium: 143 mEq/L (ref 137–147)
Total Bilirubin: 0.4 mg/dL (ref 0.3–1.2)
Total Protein: 5.6 g/dL — ABNORMAL LOW (ref 6.0–8.3)

## 2014-05-01 LAB — GLUCOSE, CAPILLARY
GLUCOSE-CAPILLARY: 103 mg/dL — AB (ref 70–99)
Glucose-Capillary: 126 mg/dL — ABNORMAL HIGH (ref 70–99)

## 2014-05-01 LAB — MRSA PCR SCREENING: MRSA BY PCR: NEGATIVE

## 2014-05-01 SURGERY — DILATATION & CURETTAGE/HYSTEROSCOPY WITH THERMACHOICE ABLATION
Anesthesia: General | Site: Vagina

## 2014-05-01 MED ORDER — LIDOCAINE HCL (PF) 1 % IJ SOLN
INTRAMUSCULAR | Status: AC
  Filled 2014-05-01: qty 5

## 2014-05-01 MED ORDER — SODIUM CHLORIDE 0.9 % IR SOLN
Status: DC | PRN
  Administered 2014-05-01: 1000 mL

## 2014-05-01 MED ORDER — HYDROCODONE-ACETAMINOPHEN 5-325 MG PO TABS
1.0000 | ORAL_TABLET | Freq: Four times a day (QID) | ORAL | Status: DC | PRN
Start: 2014-05-01 — End: 2014-05-11

## 2014-05-01 MED ORDER — MIDAZOLAM HCL 5 MG/5ML IJ SOLN
INTRAMUSCULAR | Status: DC | PRN
  Administered 2014-05-01 (×2): 1 mg via INTRAVENOUS

## 2014-05-01 MED ORDER — GLYCOPYRROLATE 0.2 MG/ML IJ SOLN
INTRAMUSCULAR | Status: AC
  Filled 2014-05-01: qty 1

## 2014-05-01 MED ORDER — ONDANSETRON HCL 8 MG PO TABS: 4.0000 mg | ORAL_TABLET | Freq: Four times a day (QID) | ORAL | Status: DC | PRN

## 2014-05-01 MED ORDER — GLYCOPYRROLATE 0.2 MG/ML IJ SOLN
INTRAMUSCULAR | Status: DC | PRN
  Administered 2014-05-01: 0.2 mg via INTRAVENOUS

## 2014-05-01 MED ORDER — FENTANYL CITRATE 0.05 MG/ML IJ SOLN
INTRAMUSCULAR | Status: AC
  Filled 2014-05-01: qty 5

## 2014-05-01 MED ORDER — CEFAZOLIN SODIUM-DEXTROSE 2-3 GM-% IV SOLR
2.0000 g | Freq: Once | INTRAVENOUS | Status: AC
  Administered 2014-05-01: 2 g via INTRAVENOUS
  Filled 2014-05-01 (×2): qty 50

## 2014-05-01 MED ORDER — ONDANSETRON HCL 4 MG/2ML IJ SOLN
INTRAMUSCULAR | Status: AC
  Filled 2014-05-01: qty 2

## 2014-05-01 MED ORDER — ONDANSETRON HCL 4 MG/2ML IJ SOLN
INTRAMUSCULAR | Status: DC | PRN
  Administered 2014-05-01: 4 mg via INTRAVENOUS

## 2014-05-01 MED ORDER — MIDAZOLAM HCL 2 MG/2ML IJ SOLN
INTRAMUSCULAR | Status: AC
  Filled 2014-05-01: qty 2

## 2014-05-01 MED ORDER — LIDOCAINE HCL 1 % IJ SOLN
INTRAMUSCULAR | Status: DC | PRN
  Administered 2014-05-01: 40 mg via INTRADERMAL

## 2014-05-01 MED ORDER — KETOROLAC TROMETHAMINE 10 MG PO TABS: 10.0000 mg | ORAL_TABLET | Freq: Three times a day (TID) | ORAL | Status: DC | PRN

## 2014-05-01 MED ORDER — PROPOFOL 10 MG/ML IV EMUL
INTRAVENOUS | Status: AC
  Filled 2014-05-01: qty 20

## 2014-05-01 MED ORDER — DEXTROSE 5 % IV SOLN
INTRAVENOUS | Status: DC | PRN
  Administered 2014-05-01: 250 mL via INTRAVENOUS

## 2014-05-01 MED ORDER — LACTATED RINGERS IV SOLN
INTRAVENOUS | Status: DC | PRN
  Administered 2014-05-01: 12:00:00 via INTRAVENOUS

## 2014-05-01 MED ORDER — KETOROLAC TROMETHAMINE 30 MG/ML IJ SOLN
30.0000 mg | Freq: Once | INTRAMUSCULAR | Status: AC
  Administered 2014-05-01: 30 mg via INTRAVENOUS
  Filled 2014-05-01: qty 1

## 2014-05-01 MED ORDER — FENTANYL CITRATE 0.05 MG/ML IJ SOLN
INTRAMUSCULAR | Status: DC | PRN
  Administered 2014-05-01 (×2): 50 ug via INTRAVENOUS
  Administered 2014-05-01: 25 ug via INTRAVENOUS
  Administered 2014-05-01 (×2): 50 ug via INTRAVENOUS
  Administered 2014-05-01: 25 ug via INTRAVENOUS

## 2014-05-01 MED ORDER — PROPOFOL 10 MG/ML IV BOLUS
INTRAVENOUS | Status: DC | PRN
  Administered 2014-05-01: 20 mg via INTRAVENOUS
  Administered 2014-05-01: 100 mg via INTRAVENOUS

## 2014-05-01 SURGICAL SUPPLY — 33 items
BAG DECANTER FOR FLEXI CONT (MISCELLANEOUS) ×3 IMPLANT
BAG HAMPER (MISCELLANEOUS) ×3 IMPLANT
CATH THERMACHOICE III (CATHETERS) ×3 IMPLANT
CLOTH BEACON ORANGE TIMEOUT ST (SAFETY) ×3 IMPLANT
COVER LIGHT HANDLE STERIS (MISCELLANEOUS) ×6 IMPLANT
COVER MAYO STAND XLG (DRAPE) ×3 IMPLANT
ELECT BLADE 6 FLAT ULTRCLN (ELECTRODE) ×3 IMPLANT
ELECT REM PT RETURN 9FT ADLT (ELECTROSURGICAL) ×3
ELECTRODE REM PT RTRN 9FT ADLT (ELECTROSURGICAL) ×1 IMPLANT
FORMALIN 10 PREFIL 120ML (MISCELLANEOUS) ×3 IMPLANT
GAUZE SPONGE 4X4 16PLY XRAY LF (GAUZE/BANDAGES/DRESSINGS) ×3 IMPLANT
GLOVE BIOGEL PI IND STRL 8 (GLOVE) ×1 IMPLANT
GLOVE BIOGEL PI INDICATOR 8 (GLOVE) ×2
GLOVE ECLIPSE 8.0 STRL XLNG CF (GLOVE) ×3 IMPLANT
GOWN STRL REUS W/TWL LRG LVL3 (GOWN DISPOSABLE) ×3 IMPLANT
GOWN STRL REUS W/TWL XL LVL3 (GOWN DISPOSABLE) ×3 IMPLANT
INST SET HYSTEROSCOPY (KITS) ×3 IMPLANT
IV D5W 500ML (IV SOLUTION) ×3 IMPLANT
IV NS 1000ML (IV SOLUTION) ×3
IV NS 1000ML BAXH (IV SOLUTION) ×1 IMPLANT
KIT ROOM TURNOVER AP CYSTO (KITS) ×3 IMPLANT
MANIFOLD NEPTUNE II (INSTRUMENTS) ×3 IMPLANT
MARKER SKIN DUAL TIP RULER LAB (MISCELLANEOUS) ×3 IMPLANT
NS IRRIG 1000ML POUR BTL (IV SOLUTION) ×3 IMPLANT
PACK BASIC III (CUSTOM PROCEDURE TRAY) ×3
PACK SRG BSC III STRL LF ECLPS (CUSTOM PROCEDURE TRAY) ×1 IMPLANT
PAD ARMBOARD 7.5X6 YLW CONV (MISCELLANEOUS) ×3 IMPLANT
PAD TELFA 3X4 1S STER (GAUZE/BANDAGES/DRESSINGS) ×3 IMPLANT
PENCIL HANDSWITCHING (ELECTRODE) ×3 IMPLANT
SET BASIN LINEN APH (SET/KITS/TRAYS/PACK) ×3 IMPLANT
SET IRRIG Y TYPE TUR BLADDER L (SET/KITS/TRAYS/PACK) ×3 IMPLANT
SHEET LAVH (DRAPES) ×3 IMPLANT
YANKAUER SUCT BULB TIP 10FT TU (MISCELLANEOUS) ×3 IMPLANT

## 2014-05-01 NOTE — Anesthesia Preprocedure Evaluation (Addendum)
Anesthesia Evaluation  Patient identified by MRN, date of birth, ID band Patient awake    Reviewed: Allergy & Precautions, H&P , NPO status , Patient's Chart, lab work & pertinent test results  History of Anesthesia Complications Negative for: history of anesthetic complications  Airway Mallampati: II  TM Distance: >3 FB Neck ROM: Full    Dental  (+) Teeth Intact   Pulmonary neg pulmonary ROS,  breath sounds clear to auscultation        Cardiovascular Exercise Tolerance: Good Rhythm:Regular Rate:Normal     Neuro/Psych    GI/Hepatic   Endo/Other  negative endocrine ROS  Renal/GU negative Renal ROS     Musculoskeletal   Abdominal   Peds  Hematology  (+) anemia ,   Anesthesia Other Findings   Reproductive/Obstetrics                            Anesthesia Physical Anesthesia Plan  ASA: I and emergent  Anesthesia Plan: General   Post-op Pain Management:    Induction: Intravenous  Airway Management Planned: LMA  Additional Equipment:   Intra-op Plan:   Post-operative Plan: Extubation in OR  Informed Consent: I have reviewed the patients History and Physical, chart, labs and discussed the procedure including the risks, benefits and alternatives for the proposed anesthesia with the patient or authorized representative who has indicated his/her understanding and acceptance.     Plan Discussed with: Anesthesiologist  Anesthesia Plan Comments:         Anesthesia Quick Evaluation

## 2014-05-01 NOTE — Progress Notes (Signed)
Patient picked up by surgery at 1200 and patient will be discharged to home after and will not be coming back to ICU.

## 2014-05-01 NOTE — Anesthesia Postprocedure Evaluation (Signed)
  Anesthesia Post-op Note  Patient: [name]  Procedure(s) Performed: Procedure(s) with comments: DILATATION & CURETTAGE/HYSTEROSCOPY WITH THERMACHOICE ABLATION (N/A) - total therapy time: 2min, 46 sec temp 87 degree Celsious D5W 65ml in 84ml out  Patient Location: PACU  Anesthesia Type:General  Level of Consciousness: awake, alert  and oriented  Airway and Oxygen Therapy: Patient Spontanous Breathing  Post-op Pain: moderate  Post-op Assessment: Post-op Vital signs reviewed, Patient's Cardiovascular Status Stable, Respiratory Function Stable, Patent Airway and No signs of Nausea or vomiting  Post-op Vital Signs: Reviewed and stable  Last Vitals:  Filed Vitals:   05/01/14 1415  BP: 126/68  Pulse: 79  Temp:   Resp: 27    Complications: No apparent anesthesia complications

## 2014-05-01 NOTE — Anesthesia Procedure Notes (Signed)
Procedure Name: LMA Insertion Date/Time: 05/01/2014 12:59 PM Performed by: Tressie Stalker E Pre-anesthesia Checklist: Patient identified, Patient being monitored, Emergency Drugs available, Timeout performed and Suction available Patient Re-evaluated:Patient Re-evaluated prior to inductionOxygen Delivery Method: Circle System Utilized Preoxygenation: Pre-oxygenation with 100% oxygen Intubation Type: IV induction Ventilation: Mask ventilation without difficulty LMA: LMA inserted LMA Size: 4.0 Number of attempts: 1 Placement Confirmation: positive ETCO2 and breath sounds checked- equal and bilateral

## 2014-05-01 NOTE — Progress Notes (Signed)
Patient ID: [name], female   DOB: 01/15/1981, 33 y.o.   MRN: [id number] 33 y.o. G2P2 Patient's last menstrual period was 04/20/2014. S/p removal of pedunculate/prolpased fibroid in the office last week with continued bleeding Now having minimal bleeding after megestrol  US Transvaginal Non-ob  05/01/2014   CLINICAL DATA:  Acute on chronic vaginal bleeding; history of uterine fibroids; anemia ; the patient is currently bleeding.  EXAM: TRANSABDOMINAL AND TRANSVAGINAL ULTRASOUND OF PELVIS  TECHNIQUE: Both transabdominal and transvaginal ultrasound examinations of the pelvis were performed. Transabdominal technique was performed for global imaging of the pelvis including uterus, ovaries, adnexal regions, and pelvic cul-de-sac. It was necessary to proceed with endovaginal exam following the transabdominal exam to visualize the uterus and adnexal structures.  COMPARISON:  None  FINDINGS: Uterus  Measurements: 9.9 x 3.1 x 6.1 cm. No fibroids or other mass visualized.  Endometrium  Thickness: 2.2 cm.  No focal abnormality visualized.  Right ovary  Measurements: 3.1 x 2.4 x 2.6 cm. There is a simple appearing cyst measuring 2.1 x 1.4 x 2 cm.  Left ovary  Measurements: 3.1 x 1.7 x 2.1 cm. Normal appearance/no adnexal mass.  Other findings  No free fluid.  IMPRESSION: 1. The uterus is normal in echotexture. No fibroids are demonstrated. The endometrial stripe is normal in thickness. If bleeding remains unresponsive to hormonal or medical therapy, sonohysterogram should be considered for focal lesion work-up. (Ref: Radiological Reasoning: Algorithmic Workup of Abnormal Vaginal Bleeding with Endovaginal Sonography and Sonohysterography. AJR 2008; 354:S56-81) 2. There is a simple appearing cyst in the right ovary. The left ovary is unremarkable.   Electronically Signed   By: David  Martinique   On: 05/01/2014 08:58   US Pelvis Complete  05/01/2014   CLINICAL DATA:  Acute on chronic vaginal bleeding; history of  uterine fibroids; anemia ; the patient is currently bleeding.  EXAM: TRANSABDOMINAL AND TRANSVAGINAL ULTRASOUND OF PELVIS  TECHNIQUE: Both transabdominal and transvaginal ultrasound examinations of the pelvis were performed. Transabdominal technique was performed for global imaging of the pelvis including uterus, ovaries, adnexal regions, and pelvic cul-de-sac. It was necessary to proceed with endovaginal exam following the transabdominal exam to visualize the uterus and adnexal structures.  COMPARISON:  None  FINDINGS: Uterus  Measurements: 9.9 x 3.1 x 6.1 cm. No fibroids or other mass visualized.  Endometrium  Thickness: 2.2 cm.  No focal abnormality visualized.  Right ovary  Measurements: 3.1 x 2.4 x 2.6 cm. There is a simple appearing cyst measuring 2.1 x 1.4 x 2 cm.  Left ovary  Measurements: 3.1 x 1.7 x 2.1 cm. Normal appearance/no adnexal mass.  Other findings  No free fluid.  IMPRESSION: 1. The uterus is normal in echotexture. No fibroids are demonstrated. The endometrial stripe is normal in thickness. If bleeding remains unresponsive to hormonal or medical therapy, sonohysterogram should be considered for focal lesion work-up. (Ref: Radiological Reasoning: Algorithmic Workup of Abnormal Vaginal Bleeding with Endovaginal Sonography and Sonohysterography. AJR 2008; 275:T70-01) 2. There is a simple appearing cyst in the right ovary. The left ovary is unremarkable.   Electronically Signed   By: David  Martinique   On: 05/01/2014 08:58    Results for orders placed or performed during the hospital encounter of 04/30/14 (from the past 24 hour(s))  CBC with Differential     Status: Abnormal   Collection Time: 04/30/14  8:31 PM  Result Value Ref Range   WBC 9.7 4.0 - 10.5 K/uL   RBC 3.25 (L) 3.87 -  5.11 MIL/uL   Hemoglobin 7.9 (L) 12.0 - 15.0 g/dL   HCT 24.1 (L) 36.0 - 46.0 %   MCV 74.2 (L) 78.0 - 100.0 fL   MCH 24.3 (L) 26.0 - 34.0 pg   MCHC 32.8 30.0 - 36.0 g/dL   RDW 13.5 11.5 - 15.5 %   Platelets 319  150 - 400 K/uL   Neutrophils Relative % 64 43 - 77 %   Neutro Abs 6.2 1.7 - 7.7 K/uL   Lymphocytes Relative 31 12 - 46 %   Lymphs Abs 3.0 0.7 - 4.0 K/uL   Monocytes Relative 3 3 - 12 %   Monocytes Absolute 0.3 0.1 - 1.0 K/uL   Eosinophils Relative 2 0 - 5 %   Eosinophils Absolute 0.2 0.0 - 0.7 K/uL   Basophils Relative 0 0 - 1 %   Basophils Absolute 0.0 0.0 - 0.1 K/uL  Basic metabolic panel     Status: Abnormal   Collection Time: 04/30/14  8:31 PM  Result Value Ref Range   Sodium 141 137 - 147 mEq/L   Potassium 3.3 (L) 3.7 - 5.3 mEq/L   Chloride 103 96 - 112 mEq/L   CO2 28 19 - 32 mEq/L   Glucose, Bld 153 (H) 70 - 99 mg/dL   BUN 11 6 - 23 mg/dL   Creatinine, Ser 0.71 0.50 - 1.10 mg/dL   Calcium 8.4 8.4 - 10.5 mg/dL   GFR calc non Af Amer >90 >90 mL/min   GFR calc Af Amer >90 >90 mL/min   Anion gap 10 5 - 15  APTT     Status: None   Collection Time: 04/30/14  8:31 PM  Result Value Ref Range   aPTT 29 24 - 37 seconds  Protime-INR     Status: None   Collection Time: 04/30/14  8:31 PM  Result Value Ref Range   Prothrombin Time 13.6 11.6 - 15.2 seconds   INR 1.02 0.00 - 1.49  Type and screen     Status: None (Preliminary result)   Collection Time: 04/30/14  8:31 PM  Result Value Ref Range   ABO/RH(D) O POS    Antibody Screen NEG    Sample Expiration 05/03/2014    Unit Number [id number]    Blood Component Type RED CELLS,LR    Unit division 00    Status of Unit ISSUED,FINAL    Transfusion Status OK TO TRANSFUSE    Crossmatch Result Compatible    Unit Number [id number]    Blood Component Type RED CELLS,LR    Unit division 00    Status of Unit ISSUED    Transfusion Status OK TO TRANSFUSE    Crossmatch Result Compatible   Prepare RBC     Status: None   Collection Time: 04/30/14  8:31 PM  Result Value Ref Range   Order Confirmation ORDER PROCESSED BY BLOOD BANK   ABO/Rh     Status: None   Collection Time: 04/30/14  8:31 PM  Result Value Ref Range   ABO/RH(D) O  POS   I-Stat Beta hCG blood, ED ([facility], [facility], [facility] only)     Status: None   Collection Time: 04/30/14  8:46 PM  Result Value Ref Range   I-stat hCG, quantitative <5.0 <5 mIU/mL   Comment 3          Wet prep, genital     Status: None   Collection Time: 04/30/14  9:35 PM  Result Value Ref Range   Yeast Wet Prep  HPF POC NONE SEEN NONE SEEN   Trich, Wet Prep NONE SEEN NONE SEEN   Clue Cells Wet Prep HPF POC NONE SEEN NONE SEEN   WBC, Wet Prep HPF POC NONE SEEN NONE SEEN  Urinalysis, Routine w reflex microscopic     Status: Abnormal   Collection Time: 04/30/14 10:47 PM  Result Value Ref Range   Color, Urine RED (A) YELLOW   APPearance CLOUDY (A) CLEAR   Specific Gravity, Urine <1.005 (L) 1.005 - 1.030   pH 7.0 5.0 - 8.0   Glucose, UA NEGATIVE NEGATIVE mg/dL   Hgb urine dipstick LARGE (A) NEGATIVE   Bilirubin Urine NEGATIVE NEGATIVE   Ketones, ur NEGATIVE NEGATIVE mg/dL   Protein, ur TRACE (A) NEGATIVE mg/dL   Urobilinogen, UA 0.2 0.0 - 1.0 mg/dL   Nitrite NEGATIVE NEGATIVE   Leukocytes, UA NEGATIVE NEGATIVE  Urine microscopic-add on     Status: None   Collection Time: 04/30/14 10:47 PM  Result Value Ref Range   RBC / HPF TOO NUMEROUS TO COUNT <3 RBC/hpf  MRSA PCR Screening     Status: None   Collection Time: 04/30/14 11:25 PM  Result Value Ref Range   MRSA by PCR NEGATIVE NEGATIVE  Glucose, capillary     Status: Abnormal   Collection Time: 05/01/14 12:14 AM  Result Value Ref Range   Glucose-Capillary 126 (H) 70 - 99 mg/dL   Comment 1 Notify RN   Comprehensive metabolic panel     Status: Abnormal   Collection Time: 05/01/14  5:38 AM  Result Value Ref Range   Sodium 143 137 - 147 mEq/L   Potassium 3.9 3.7 - 5.3 mEq/L   Chloride 108 96 - 112 mEq/L   CO2 25 19 - 32 mEq/L   Glucose, Bld 111 (H) 70 - 99 mg/dL   BUN 9 6 - 23 mg/dL   Creatinine, Ser 0.58 0.50 - 1.10 mg/dL   Calcium 8.3 (L) 8.4 - 10.5 mg/dL   Total Protein 5.6 (L) 6.0 - 8.3 g/dL   Albumin 3.0 (L) 3.5 - 5.2 g/dL   AST  19 0 - 37 U/L   ALT 17 0 - 35 U/L   Alkaline Phosphatase 43 39 - 117 U/L   Total Bilirubin 0.4 0.3 - 1.2 mg/dL   GFR calc non Af Amer >90 >90 mL/min   GFR calc Af Amer >90 >90 mL/min   Anion gap 10 5 - 15  CBC with Differential     Status: Abnormal   Collection Time: 05/01/14  5:38 AM  Result Value Ref Range   WBC 8.9 4.0 - 10.5 K/uL   RBC 3.32 (L) 3.87 - 5.11 MIL/uL   Hemoglobin 8.7 (L) 12.0 - 15.0 g/dL   HCT 25.7 (L) 36.0 - 46.0 %   MCV 77.4 (L) 78.0 - 100.0 fL   MCH 26.2 26.0 - 34.0 pg   MCHC 33.9 30.0 - 36.0 g/dL   RDW 14.9 11.5 - 15.5 %   Platelets 223 150 - 400 K/uL   Neutrophils Relative % 75 43 - 77 %   Neutro Abs 6.7 1.7 - 7.7 K/uL   Lymphocytes Relative 20 12 - 46 %   Lymphs Abs 1.8 0.7 - 4.0 K/uL   Monocytes Relative 5 3 - 12 %   Monocytes Absolute 0.4 0.1 - 1.0 K/uL   Eosinophils Relative 1 0 - 5 %   Eosinophils Absolute 0.1 0.0 - 0.7 K/uL   Basophils Relative 0 0 - 1 %  Basophils Absolute 0.0 0.0 - 0.1 K/uL    Discussed options Proceed with hysteroscopy uterine curettage endo ablation

## 2014-05-01 NOTE — Progress Notes (Signed)
[facility] PROGRESS NOTE  [name] RFX:[id number] DOB: August 28, 1980 DOA: 04/30/2014 PCP: Rubbie Battiest, MD  Assessment/Plan:  Principal Problem:   Vaginal bleeding - Pt is s/p transfusion - Will monitor CBCs every 8 hours -OB/GYN per EMR consulted and will evaluate patient in consultation - We'll transfuse his hemoglobin less than 8.0 with active bleeding. Patient denies any active bleeding currently. - Megace on board  Active Problems:   Anemia - Secondary to active bleeding prior to admission. Currently has stopped bleeding. Patient is status post transfusion and hemoglobin levels up. We'll monitor hemoglobin levels and transfuse as necessary    Syncope - Most likely due to significant blood loss and decreased intravascular volume. Patient is on IV fluid rehydration as well as has received transfusion. - Suspect this was resolved with cessation of bleeding and improved intravascular volume.   Code Status: stable Family Communication: None at bedside  Disposition Plan: Pending continued improvement in condition for now we'll monitor a MedSurg floor with serial CBCs   Consultants:  OB/GYN  Procedures:  Transfusion of packed red blood cells  Antibiotics:  None  HPI/Subjective: Patient has no new complaints. No acute issues overnight. Denies any active bleeding  Objective: Filed Vitals:   05/01/14 1000  BP: 102/44  Pulse: 72  Temp:   Resp: 15    Intake/Output Summary (Last 24 hours) at 05/01/14 1037 Last data filed at 05/01/14 1000  Gross per 24 hour  Intake 5676.67 ml  Output   1800 ml  Net 3876.67 ml   Filed Weights   04/30/14 2018 04/30/14 2336 05/01/14 0500  Weight: 78.926 kg (174 lb) 81.4 kg (179 lb 7.3 oz) 81.4 kg (179 lb 7.3 oz)    Exam:   General:  Pt in nad, alert and awake  Cardiovascular: rrr, no mrg  Respiratory: cta bl, no wheezes  Abdomen: soft, NT, ND  Musculoskeletal: no cyanosis or clubbing on limited exam   Data  Reviewed: Basic Metabolic Panel:  Recent Labs Lab 04/30/14 2031 05/01/14 0538  NA 141 143  K 3.3* 3.9  CL 103 108  CO2 28 25  GLUCOSE 153* 111*  BUN 11 9  CREATININE 0.71 0.58  CALCIUM 8.4 8.3*   Liver Function Tests:  Recent Labs Lab 05/01/14 0538  AST 19  ALT 17  ALKPHOS 43  BILITOT 0.4  PROT 5.6*  ALBUMIN 3.0*   No results for input(s): LIPASE, AMYLASE in the last 168 hours. No results for input(s): AMMONIA in the last 168 hours. CBC:  Recent Labs Lab 04/26/14 1226 04/30/14 2031 05/01/14 0538  WBC  --  9.7 8.9  NEUTROABS  --  6.2 6.7  HGB 9.1* 7.9* 8.7*  HCT  --  24.1* 25.7*  MCV  --  74.2* 77.4*  PLT  --  319 223   Cardiac Enzymes: No results for input(s): CKTOTAL, CKMB, CKMBINDEX, TROPONINI in the last 168 hours. BNP (last 3 results) No results for input(s): PROBNP in the last 8760 hours. CBG:  Recent Labs Lab 05/01/14 0014  GLUCAP 126*    Recent Results (from the past 240 hour(s))  Wet prep, genital     Status: None   Collection Time: 04/30/14  9:35 PM  Result Value Ref Range Status   Yeast Wet Prep HPF POC NONE SEEN NONE SEEN Final   Trich, Wet Prep NONE SEEN NONE SEEN Final   Clue Cells Wet Prep HPF POC NONE SEEN NONE SEEN Final   WBC, Wet Prep HPF POC NONE  SEEN NONE SEEN Final  MRSA PCR Screening     Status: None   Collection Time: 04/30/14 11:25 PM  Result Value Ref Range Status   MRSA by PCR NEGATIVE NEGATIVE Final    Comment:        The GeneXpert MRSA Assay (FDA approved for NASAL specimens only), is one component of a comprehensive MRSA colonization surveillance program. It is not intended to diagnose MRSA infection nor to guide or monitor treatment for MRSA infections.      Studies: US Transvaginal Non-ob  May 03, 2014   CLINICAL DATA:  Acute on chronic vaginal bleeding; history of uterine fibroids; anemia ; the patient is currently bleeding.  EXAM: TRANSABDOMINAL AND TRANSVAGINAL ULTRASOUND OF PELVIS  TECHNIQUE: Both  transabdominal and transvaginal ultrasound examinations of the pelvis were performed. Transabdominal technique was performed for global imaging of the pelvis including uterus, ovaries, adnexal regions, and pelvic cul-de-sac. It was necessary to proceed with endovaginal exam following the transabdominal exam to visualize the uterus and adnexal structures.  COMPARISON:  None  FINDINGS: Uterus  Measurements: 9.9 x 3.1 x 6.1 cm. No fibroids or other mass visualized.  Endometrium  Thickness: 2.2 cm.  No focal abnormality visualized.  Right ovary  Measurements: 3.1 x 2.4 x 2.6 cm. There is a simple appearing cyst measuring 2.1 x 1.4 x 2 cm.  Left ovary  Measurements: 3.1 x 1.7 x 2.1 cm. Normal appearance/no adnexal mass.  Other findings  No free fluid.  IMPRESSION: 1. The uterus is normal in echotexture. No fibroids are demonstrated. The endometrial stripe is normal in thickness. If bleeding remains unresponsive to hormonal or medical therapy, sonohysterogram should be considered for focal lesion work-up. (Ref: Radiological Reasoning: Algorithmic Workup of Abnormal Vaginal Bleeding with Endovaginal Sonography and Sonohysterography. AJR 2008; 664:Q03-47) 2. There is a simple appearing cyst in the right ovary. The left ovary is unremarkable.   Electronically Signed   By: David  Martinique   On: May 03, 2014 08:58   US Pelvis Complete  May 03, 2014   CLINICAL DATA:  Acute on chronic vaginal bleeding; history of uterine fibroids; anemia ; the patient is currently bleeding.  EXAM: TRANSABDOMINAL AND TRANSVAGINAL ULTRASOUND OF PELVIS  TECHNIQUE: Both transabdominal and transvaginal ultrasound examinations of the pelvis were performed. Transabdominal technique was performed for global imaging of the pelvis including uterus, ovaries, adnexal regions, and pelvic cul-de-sac. It was necessary to proceed with endovaginal exam following the transabdominal exam to visualize the uterus and adnexal structures.  COMPARISON:  None  FINDINGS:  Uterus  Measurements: 9.9 x 3.1 x 6.1 cm. No fibroids or other mass visualized.  Endometrium  Thickness: 2.2 cm.  No focal abnormality visualized.  Right ovary  Measurements: 3.1 x 2.4 x 2.6 cm. There is a simple appearing cyst measuring 2.1 x 1.4 x 2 cm.  Left ovary  Measurements: 3.1 x 1.7 x 2.1 cm. Normal appearance/no adnexal mass.  Other findings  No free fluid.  IMPRESSION: 1. The uterus is normal in echotexture. No fibroids are demonstrated. The endometrial stripe is normal in thickness. If bleeding remains unresponsive to hormonal or medical therapy, sonohysterogram should be considered for focal lesion work-up. (Ref: Radiological Reasoning: Algorithmic Workup of Abnormal Vaginal Bleeding with Endovaginal Sonography and Sonohysterography. AJR 2008; 425:Z56-38) 2. There is a simple appearing cyst in the right ovary. The left ovary is unremarkable.   Electronically Signed   By: David  Martinique   On: 05-03-14 08:58    Scheduled Meds: . megestrol  120 mg Oral Daily  Continuous Infusions: . sodium chloride 100 mL/hr at 05/01/14 0014    Time spent: > 35 minutes    Velvet Bathe  [facility] Pager [phone]. If 7PM-7AM, please contact night-coverage at [email or web address], password [facility] 05/01/2014, 10:37 AM  LOS: 1 day

## 2014-05-01 NOTE — Care Management Utilization Note (Signed)
UR review complete.  

## 2014-05-01 NOTE — Op Note (Signed)
Preoperative diagnosis: Menometrorrhagia                                        Dysmenorrhea                                         Anemia  Postoperative diagnoses: Same as above   Procedure: Hysteroscopy, uterine curettage, endometrial ablation  Surgeon: Elonda Husky MD  Anesthesia: Laryngeal mask airway  Findings: The endometrium was normal. There were no fibroid or other abnormalities.  Description of operation: The patient was taken to the operating room and placed in the supine position. She underwent general anesthesia using the laryngeal mask airway. She was placed in the dorsal lithotomy position and prepped and draped in the usual sterile fashion. A Graves speculum was placed and the anterior cervical lip was grasped with a single-tooth tenaculum. The cervix was dilated serially to allow passage of the hysteroscope. Diagnostic hysteroscopy was performed and was found to be normal. A vigorous uterine curettage was then performed and all tissue sent to pathology for evaluation. The ThermaChoice 3 endometrial ablation balloon was then used were 16 cc of D5W was required to maintain a pressure of 190-200 mm of mercury throughout the procedure. [place]atl therapy time was 8:56.  All of the equipment worked well throughout the procedure. All of the fluid was returned at the end of the procedure. The patient was awakened from anesthesia and taken to the recovery room in good stable condition all counts were correct. She received 2 g of Ancef and 30 mg of Toradol preoperatively. She will be discharged from the recovery room and followed up in the office in 1- 2 weeks.  Rainee Sweatt H 05/01/2014 1:47 PM

## 2014-05-01 NOTE — Transfer of Care (Signed)
Immediate Anesthesia Transfer of Care Note  Patient: [name]  Procedure(s) Performed: Procedure(s) with comments: DILATATION & CURETTAGE/HYSTEROSCOPY WITH THERMACHOICE ABLATION (N/A) - total therapy time: 28min, 46 sec temp 87 degree Celsious D5W 37ml in 37ml out  Patient Location: PACU  Anesthesia Type:General  Level of Consciousness: awake  Airway & Oxygen Therapy: Patient Spontanous Breathing and Patient connected to face mask oxygen  Post-op Assessment: Report given to PACU RN  Post vital signs: Reviewed and stable  Complications: No apparent anesthesia complications

## 2014-05-01 NOTE — Discharge Instructions (Signed)
Endometrial Ablation Endometrial ablation removes the lining of the uterus (endometrium). It is usually a same-day, outpatient treatment. Ablation helps avoid major surgery, such as surgery to remove the cervix and uterus (hysterectomy). After endometrial ablation, you will have little or no menstrual bleeding and may not be able to have children. However, if you are premenopausal, you will need to use a reliable method of birth control following the procedure because of the small chance that pregnancy can occur. There are different reasons to have this procedure, which include:  Heavy periods.  Bleeding that is causing anemia.  Irregular bleeding.  Bleeding fibroids on the lining inside the uterus if they are smaller than 3 centimeters. This procedure should not be done if:  You want children in the future.  You have severe cramps with your menstrual period.  You have precancerous or cancerous cells in your uterus.  You were recently pregnant.  You have gone through menopause.  You have had major surgery on the uterus, such as a cesarean delivery. LET YOUR HEALTH CARE PROVIDER KNOW ABOUT:  Any allergies you have.  All medicines you are taking, including vitamins, herbs, eye drops, creams, and over-the-counter medicines.  Previous problems you or members of your family have had with the use of anesthetics.  Any blood disorders you have.  Previous surgeries you have had.  Medical conditions you have. RISKS AND COMPLICATIONS  Generally, this is a safe procedure. However, as with any procedure, complications can occur. Possible complications include:  Perforation of the uterus.  Bleeding.  Infection of the uterus, bladder, or vagina.  Injury to surrounding organs.  An air bubble to the lung (air embolus).  Pregnancy following the procedure.  Failure of the procedure to help the problem, requiring hysterectomy.  Decreased ability to diagnose cancer in the lining of  the uterus. BEFORE THE PROCEDURE  The lining of the uterus must be tested to make sure there is no pre-cancerous or cancer cells present.  An ultrasound may be performed to look at the size of the uterus and to check for abnormalities.  Medicines may be given to thin the lining of the uterus. PROCEDURE  During the procedure, your health care provider will use a tool called a resectoscope to help see inside your uterus. There are different ways to remove the lining of your uterus.   Radiofrequency - This method uses a radiofrequency-alternating electric current to remove the lining of the uterus.  Cryotherapy - This method uses extreme cold to freeze the lining of the uterus.  Heated-Free Liquid - This method uses heated salt (saline) solution to remove the lining of the uterus.  Microwave - This method uses high-energy microwaves to heat up the lining of the uterus to remove it.  Thermal balloon - This method involves inserting a catheter with a balloon tip into the uterus. The balloon tip is filled with heated fluid to remove the lining of the uterus. AFTER THE PROCEDURE  After your procedure, do not have sexual intercourse or insert anything into your vagina until permitted by your health care provider. After the procedure, you may experience:  Cramps.  Vaginal discharge.  Frequent urination. Document Released: 03/28/2004 Document Revised: 01/19/2013 Document Reviewed: 10/20/2012 Exit[employer] Patient Information 2015 [employer]. This information is not intended to replace advice given to you by your health care provider. Make sure you discuss any questions you have with your health care provider.  

## 2014-05-02 ENCOUNTER — Encounter (HOSPITAL_COMMUNITY): Payer: Self-pay | Admitting: Obstetrics & Gynecology

## 2014-05-02 LAB — TYPE AND SCREEN
ABO/RH(D): O POS
Antibody Screen: NEGATIVE
UNIT DIVISION: 0
Unit division: 0

## 2014-05-02 LAB — GC/CHLAMYDIA PROBE AMP
CT PROBE, AMP APTIMA: NEGATIVE
GC Probe RNA: NEGATIVE

## 2014-05-11 ENCOUNTER — Other Ambulatory Visit: Payer: Self-pay | Admitting: Obstetrics & Gynecology

## 2014-05-11 ENCOUNTER — Encounter: Payer: Self-pay | Admitting: Obstetrics & Gynecology

## 2014-05-11 ENCOUNTER — Ambulatory Visit (INDEPENDENT_AMBULATORY_CARE_PROVIDER_SITE_OTHER): Payer: Managed Care, Other (non HMO) | Admitting: Obstetrics & Gynecology

## 2014-05-11 ENCOUNTER — Ambulatory Visit (INDEPENDENT_AMBULATORY_CARE_PROVIDER_SITE_OTHER): Payer: Managed Care, Other (non HMO)

## 2014-05-11 VITALS — BP 144/80 | Ht 67.0 in | Wt 172.0 lb

## 2014-05-11 DIAGNOSIS — Z130 Encounter for screening for diseases of the blood and blood-forming organs and certain disorders involving the immune mechanism: Secondary | ICD-10-CM

## 2014-05-11 DIAGNOSIS — D250 Submucous leiomyoma of uterus: Secondary | ICD-10-CM

## 2014-05-11 DIAGNOSIS — N939 Abnormal uterine and vaginal bleeding, unspecified: Secondary | ICD-10-CM

## 2014-05-11 DIAGNOSIS — Z9889 Other specified postprocedural states: Secondary | ICD-10-CM

## 2014-05-11 DIAGNOSIS — R102 Pelvic and perineal pain: Secondary | ICD-10-CM

## 2014-05-11 DIAGNOSIS — N949 Unspecified condition associated with female genital organs and menstrual cycle: Secondary | ICD-10-CM

## 2014-05-11 LAB — POCT HEMOGLOBIN: HEMOGLOBIN: 9.4 g/dL — AB (ref 12.2–16.2)

## 2014-05-11 NOTE — Progress Notes (Signed)
Patient ID: [name], female   DOB: [birth year], 33 y.o.   MRN: [id number]  HPI: Patient returns for routine postoperative follow-up having undergone hysteroscopy uterine curettage endometrial ablation on 05/01/2014. The patient's early postoperative recovery while in the hospital was notable for transfusion pre op. Since hospital discharge the patient reports benign pathology.   No current outpatient prescriptions on file.   No current facility-administered medications for this visit.    Physical Exam: negative  Diagnostic Tests:   Impression: normal post ablation course  Plan: Follow up prn or 1 year

## 2014-06-07 NOTE — Discharge Summary (Signed)
Physician Discharge Summary  Patient ID: [name] MRN: [id number] DOB/AGE: 36/14/1982 36 y.o.  Admit date: 04/30/2014 Discharge date: 06/07/2014  Admission Diagnoses: Menometrorrhagia Anemia S/P removal of prolapsed myoma 2 weeks prior  Discharge Diagnoses:  Principal Problem:   Vaginal bleeding Active Problems:   Anemia   Syncope   Discharged Condition: good  Hospital Course: Admitted for symptomatic anemia, transfused and bleeding managed pharmacologically then underwent hysteroscopy uterine curettage endometrial ablation  Consults: hospitalist  Significant Diagnostic Studies: labs:  and sonogram  Treatments: surgery: as above and transfusion  Discharge Exam: Blood pressure 124/83, pulse 75, temperature 98.7 F (37.1 C), temperature source Oral, resp. rate 16, height 5\' 7"  (1.702 m), weight 179 lb (81.194 kg), last menstrual period 04/20/2014, SpO2 100 %. General appearance: alert, cooperative and no distress GI: soft, non-tender; bowel sounds normal; no masses,  no organomegaly Pelvic: cervix normal in appearance, external genitalia normal, no adnexal masses or tenderness, no cervical motion tenderness, rectovaginal septum normal, uterus normal size, shape, and consistency and vagina normal without discharge  Disposition: 01-Home or Self Care  Discharge Instructions    Call MD for:  persistant nausea and vomiting    Complete by:  As directed      Call MD for:  severe uncontrolled pain    Complete by:  As directed      Call MD for:  temperature >100.4    Complete by:  As directed      Diet - low sodium heart healthy    Complete by:  As directed      Increase activity slowly    Complete by:  As directed             Medication List    STOP taking these medications        ferrous sulfate 325 (65 FE) MG EC tablet     ibuprofen 200 MG tablet  Commonly known as:  ADVIL,MOTRIN           Follow-up Information    Follow up with Florian Buff, MD.   Specialties:  Obstetrics and Gynecology, Radiology   Why:  keep scheduled   Contact information:   [place] [id number] [phone]       Signed: Florian Buff 06/07/2014, 8:11 PM

## 2014-07-21 ENCOUNTER — Encounter: Payer: Self-pay | Admitting: Family Medicine

## 2014-07-21 ENCOUNTER — Ambulatory Visit (INDEPENDENT_AMBULATORY_CARE_PROVIDER_SITE_OTHER): Payer: Managed Care, Other (non HMO) | Admitting: Family Medicine

## 2014-07-21 VITALS — BP 122/80 | Temp 98.5°F | Ht 67.0 in | Wt 178.0 lb

## 2014-07-21 DIAGNOSIS — I889 Nonspecific lymphadenitis, unspecified: Secondary | ICD-10-CM

## 2014-07-21 DIAGNOSIS — J329 Chronic sinusitis, unspecified: Secondary | ICD-10-CM

## 2014-07-21 MED ORDER — CEFDINIR 300 MG PO CAPS: 300.0000 mg | ORAL_CAPSULE | Freq: Two times a day (BID) | ORAL | Status: DC

## 2014-07-21 NOTE — Progress Notes (Signed)
   Subjective:    Patient ID: [name], female    DOB: 03/19/1981, 34 y.o.   MRN: [id number]  Otalgia  There is pain in the left ear. This is a new problem. The current episode started in the past 7 days. The problem occurs constantly. The problem has been unchanged. The maximum temperature recorded prior to her arrival was 102 - 102.9 F. The pain is moderate. Associated symptoms include a sore throat. Associated symptoms comments: congestion. Treatments tried: Afrin, Mucinex, Dayquil, Advil, Benadryl, Aleve, cough drops, throat spray, humidifier. The treatment provided no relief.   Patient states that in November 2015 she had a endometrial ablation done.   Left sided neck pain  Hurt with swollowing  Highest temp,  Vomiting with the cough   Dim energy,   significant vomiting no diarrhea mild abdominal discomfort   Review of Systems  HENT: Positive for ear pain and sore throat.     ROS otherwise negative    Objective:   Physical Exam  alert moderate malaise left TM retracted tender left anterior cervical nodes pharynx normal lungs clear. Heart regular in rhythm.       Assessment & Plan:   impression #1 rhinosinusitis/cervical lymphadenitis with underlying viral syndrome plan symptomatic care discussed. Antibiotics prescribed. Warning signs discussed. WSL

## 2015-03-21 ENCOUNTER — Ambulatory Visit: Payer: Managed Care, Other (non HMO)

## 2015-05-23 ENCOUNTER — Ambulatory Visit (INDEPENDENT_AMBULATORY_CARE_PROVIDER_SITE_OTHER): Payer: Managed Care, Other (non HMO) | Admitting: Nurse Practitioner

## 2015-05-23 ENCOUNTER — Encounter: Payer: Self-pay | Admitting: Nurse Practitioner

## 2015-05-23 VITALS — BP 112/78 | Temp 98.9°F | Ht 67.0 in | Wt 181.4 lb

## 2015-05-23 DIAGNOSIS — R21 Rash and other nonspecific skin eruption: Secondary | ICD-10-CM

## 2015-05-23 MED ORDER — CEPHALEXIN 500 MG PO CAPS: 500.0000 mg | ORAL_CAPSULE | Freq: Four times a day (QID) | ORAL | Status: DC

## 2015-05-23 MED ORDER — TRIAMCINOLONE ACETONIDE 0.1 % EX CREA: 1.0000 "application " | TOPICAL_CREAM | Freq: Two times a day (BID) | CUTANEOUS | Status: DC

## 2015-05-23 MED ORDER — KETOCONAZOLE 2 % EX CREA: 1.0000 "application " | TOPICAL_CREAM | Freq: Two times a day (BID) | CUTANEOUS | Status: DC

## 2015-05-26 ENCOUNTER — Encounter: Payer: Self-pay | Admitting: Nurse Practitioner

## 2015-05-26 NOTE — Progress Notes (Signed)
Subjective:  Presents for infection of the the palm of the right hand that began on 11/1. Was taking a class doing pedicures. Her client had a skin condition. No gloves available. Her instructor told her to proceed without gloves. Rash has slowly spread since. Itching, burning and pain. No fever. Localized to this area. Has tried tea tree oil, OTC antifungal topicals and vinegar. No other known contacts.   Objective:   BP 112/78 mmHg  Temp(Src) 98.9 F (37.2 C) (Oral)  Ht 5\' 7"  (1.702 m)  Wt 181 lb 6 oz (82.271 kg)  BMI 28.40 kg/m2 NAD. Alert, oriented. Moderately erythematous confluent rash covering most of the right palm area. Clear bullae noted with one superficial open area. Skin otherwise clear.   Assessment: Rash and nonspecific skin eruption  Plan: fungal versus possible element of bullous impetigo Meds ordered this encounter  Medications  . ketoconazole (NIZORAL) 2 % cream    Sig: Apply 1 application topically 2 (two) times daily.    Dispense:  30 g    Refill:  4    Order Specific Question:  Supervising Provider    Answer:  Mikey Kirschner [[id number]]  . triamcinolone cream (KENALOG) 0.1 %    Sig: Apply 1 application topically 2 (two) times daily. Prn rash; use up to 2 weeks    Dispense:  30 g    Refill:  0    Order Specific Question:  Supervising Provider    Answer:  Mikey Kirschner [[id number]]  . cephALEXin (KEFLEX) 500 MG capsule    Sig: Take 1 capsule (500 mg total) by mouth 4 (four) times daily.    Dispense:  28 capsule    Refill:  0    Order Specific Question:  Supervising Provider    Answer:  Mikey Kirschner [[id number]]   Call back in 7-10 days if no improvement, sooner if worse.

## 2015-05-30 ENCOUNTER — Ambulatory Visit (INDEPENDENT_AMBULATORY_CARE_PROVIDER_SITE_OTHER): Payer: Managed Care, Other (non HMO)

## 2015-05-30 DIAGNOSIS — Z23 Encounter for immunization: Secondary | ICD-10-CM

## 2016-03-03 ENCOUNTER — Encounter: Payer: Self-pay | Admitting: Family Medicine

## 2016-03-03 ENCOUNTER — Ambulatory Visit (INDEPENDENT_AMBULATORY_CARE_PROVIDER_SITE_OTHER): Payer: BLUE CROSS/BLUE SHIELD | Admitting: Family Medicine

## 2016-03-03 VITALS — BP 128/84 | Ht 67.0 in | Wt 191.4 lb

## 2016-03-03 DIAGNOSIS — S39012A Strain of muscle, fascia and tendon of lower back, initial encounter: Secondary | ICD-10-CM

## 2016-03-03 MED ORDER — TIZANIDINE HCL 4 MG PO CAPS: ORAL_CAPSULE | ORAL | 0 refills | Status: DC

## 2016-03-03 MED ORDER — ETODOLAC 400 MG PO TABS: ORAL_TABLET | ORAL | 3 refills | Status: DC

## 2016-03-03 NOTE — Patient Instructions (Signed)
Do these in some combination for ten to fifteen minutes three times per week  Back Exercises The following exercises strengthen the muscles that help to support the back. They also help to keep the lower back flexible. Doing these exercises can help to prevent back pain or lessen existing pain. If you have back pain or discomfort, try doing these exercises 2-3 times each day or as told by your health care provider. When the pain goes away, do them once each day, but increase the number of times that you repeat the steps for each exercise (do more repetitions). If you do not have back pain or discomfort, do these exercises once each day or as told by your health care provider. EXERCISES Single Knee to Chest Repeat these steps 3-5 times for each leg: 1. Lie on your back on a firm bed or the floor with your legs extended. 2. Bring one knee to your chest. Your other leg should stay extended and in contact with the floor. 3. Hold your knee in place by grabbing your knee or thigh. 4. Pull on your knee until you feel a gentle stretch in your lower back. 5. Hold the stretch for 10-30 seconds. 6. Slowly release and straighten your leg. Pelvic Tilt Repeat these steps 5-10 times: 1. Lie on your back on a firm bed or the floor with your legs extended. 2. Bend your knees so they are pointing toward the ceiling and your feet are flat on the floor. 3. Tighten your lower abdominal muscles to press your lower back against the floor. This motion will tilt your pelvis so your tailbone points up toward the ceiling instead of pointing to your feet or the floor. 4. With gentle tension and even breathing, hold this position for 5-10 seconds. Cat-Cow Repeat these steps until your lower back becomes more flexible: 1. Get into a hands-and-knees position on a firm surface. Keep your hands under your shoulders, and keep your knees under your hips. You may place padding under your knees for comfort. 2. Let your head hang  down, and point your tailbone toward the floor so your lower back becomes rounded like the back of a cat. 3. Hold this position for 5 seconds. 4. Slowly lift your head and point your tailbone up toward the ceiling so your back forms a sagging arch like the back of a cow. 5. Hold this position for 5 seconds. Press-Ups Repeat these steps 5-10 times: 1. Lie on your abdomen (face-down) on the floor. 2. Place your palms near your head, about shoulder-width apart. 3. While you keep your back as relaxed as possible and keep your hips on the floor, slowly straighten your arms to raise the top half of your body and lift your shoulders. Do not use your back muscles to raise your upper torso. You may adjust the placement of your hands to make yourself more comfortable. 4. Hold this position for 5 seconds while you keep your back relaxed. 5. Slowly return to lying flat on the floor. Bridges Repeat these steps 10 times: 1. Lie on your back on a firm surface. 2. Bend your knees so they are pointing toward the ceiling and your feet are flat on the floor. 3. Tighten your buttocks muscles and lift your buttocks off of the floor until your waist is at almost the same height as your knees. You should feel the muscles working in your buttocks and the back of your thighs. If you do not feel these muscles, slide  your feet 1-2 inches farther away from your buttocks. 4. Hold this position for 3-5 seconds. 5. Slowly lower your hips to the starting position, and allow your buttocks muscles to relax completely. If this exercise is too easy, try doing it with your arms crossed over your chest. Abdominal Crunches Repeat these steps 5-10 times: 1. Lie on your back on a firm bed or the floor with your legs extended. 2. Bend your knees so they are pointing toward the ceiling and your feet are flat on the floor. 3. Cross your arms over your chest. 4. Tip your chin slightly toward your chest without bending your  neck. 5. Tighten your abdominal muscles and slowly raise your trunk (torso) high enough to lift your shoulder blades a tiny bit off of the floor. Avoid raising your torso higher than that, because it can put too much stress on your low back and it does not help to strengthen your abdominal muscles. 6. Slowly return to your starting position. Back Lifts Repeat these steps 5-10 times: 1. Lie on your abdomen (face-down) with your arms at your sides, and rest your forehead on the floor. 2. Tighten the muscles in your legs and your buttocks. 3. Slowly lift your chest off of the floor while you keep your hips pressed to the floor. Keep the back of your head in line with the curve in your back. Your eyes should be looking at the floor. 4. Hold this position for 3-5 seconds. 5. Slowly return to your starting position. SEEK MEDICAL CARE IF:  Your back pain or discomfort gets much worse when you do an exercise.  Your back pain or discomfort does not lessen within 2 hours after you exercise. If you have any of these problems, stop doing these exercises right away. Do not do them again unless your health care provider says that you can. SEEK IMMEDIATE MEDICAL CARE IF:  You develop sudden, severe back pain. If this happens, stop doing the exercises right away. Do not do them again unless your health care provider says that you can.   This information is not intended to replace advice given to you by your health care provider. Make sure you discuss any questions you have with your health care provider.   Document Released: 06/26/2004 Document Revised: 02/07/2015 Document Reviewed: 07/13/2014 Elsevier Interactive Patient Education [employer].

## 2016-03-03 NOTE — Progress Notes (Signed)
   Subjective:    Patient ID: [name], female    DOB: Feb 12, 1981, 35 y.o.   MRN: [id number]  Back Pain  This is a new problem. The current episode started in the past 7 days. The pain is present in the lumbar spine. The pain is worse during the night. The symptoms are aggravated by bending and standing. Treatments tried: Acupucture, massage,electrotherapy, epsom salt, stretches, hot bath.   Patient states would also like to discuss cholesterol. Acupuncture specialist recommended a Chol panel , saw something in her eyes but did not use the word xanthelesma,,     Massage times two  accupunctur locally  Back overll fine and good,,walks a few times per wk  Four to five   Review of Systems  Musculoskeletal: Positive for back pain.       Objective:   Physical Exam Alert vitals stable, NAD. Blood pressure good on repeat. HEENT normal. Lungs clear. Heart regular rate and rhythm. No true xanthelasma evident on exam right lower lumbar tenderness to deep palpation negative straight leg raise       Assessment & Plan:  Patient likely experiencing lumbar strain discussed at length. Patient has never had lipid panel declines at this time. Anti-inflammatory medicine and muscle spasm as prescribed local measures discussed long-term exercises encourage WSL

## 2016-03-21 ENCOUNTER — Telehealth: Payer: Self-pay | Admitting: Family Medicine

## 2016-03-21 NOTE — Telephone Encounter (Signed)
Review blood work results in [facility], water.

## 2016-03-25 ENCOUNTER — Ambulatory Visit: Payer: Managed Care, Other (non HMO)

## 2016-03-31 ENCOUNTER — Ambulatory Visit (INDEPENDENT_AMBULATORY_CARE_PROVIDER_SITE_OTHER): Payer: BLUE CROSS/BLUE SHIELD | Admitting: Obstetrics & Gynecology

## 2016-03-31 ENCOUNTER — Encounter: Payer: Self-pay | Admitting: Obstetrics & Gynecology

## 2016-03-31 ENCOUNTER — Other Ambulatory Visit (HOSPITAL_COMMUNITY)
Admission: RE | Admit: 2016-03-31 | Discharge: 2016-03-31 | Disposition: A | Discharge status: Home / Self Care | Payer: BLUE CROSS/BLUE SHIELD | Source: Ambulatory Visit | Attending: Obstetrics & Gynecology | Admitting: Obstetrics & Gynecology

## 2016-03-31 VITALS — BP 120/80 | HR 72 | Ht 67.0 in | Wt 194.0 lb

## 2016-03-31 DIAGNOSIS — Z1151 Encounter for screening for human papillomavirus (HPV): Secondary | ICD-10-CM | POA: Diagnosis not present

## 2016-03-31 DIAGNOSIS — Z01419 Encounter for gynecological examination (general) (routine) without abnormal findings: Secondary | ICD-10-CM

## 2016-03-31 MED ORDER — NAPROXEN SODIUM 550 MG PO TABS: 550.0000 mg | ORAL_TABLET | Freq: Two times a day (BID) | ORAL | 1 refills | Status: DC

## 2016-03-31 NOTE — Progress Notes (Signed)
Subjective:     [name] is a 35 y.o. female here for a routine exam.  Patient's last menstrual period was 03/17/2016. No obstetric history on file. Birth Control Method:  Tubal ligation Menstrual Calendar(currently): regular  Current complaints:  crampy periods.   Current acute medical issues:  none   Recent Gynecologic History Patient's last menstrual period was 03/17/2016. Last Pap: 2015,  normal Last mammogram: ,    Past Medical History:  Diagnosis Date  . Anemia   . Uterine fibroid     Past Surgical History:  Procedure Laterality Date  . DILITATION & CURRETTAGE/HYSTROSCOPY WITH THERMACHOICE ABLATION N/A 05/01/2014   Procedure: DILATATION & CURETTAGE/HYSTEROSCOPY WITH THERMACHOICE ABLATION;  Surgeon: Florian Buff, MD;  Location: [facility];  Service: Gynecology;  Laterality: N/A;  total therapy time: 16min, 46 sec temp 87 degree Celsious D5W 37ml in 56ml out  . TUBAL LIGATION      OB History    No data available      Social History   Social History  . Marital status: Married    Spouse name: N/A  . Number of children: N/A  . Years of education: N/A   Social History Main Topics  . Smoking status: Never Smoker  . Smokeless tobacco: Never Used  . Alcohol use No  . Drug use: No  . Sexual activity: Yes   Other Topics Concern  . None   Social History Narrative  . None    Family History  Problem Relation Age of Onset  . Anuerysm Maternal Grandmother   . Hyperlipidemia Mother   . Other Mother     overweight  . Hyperlipidemia Father   . Other Father     overweight     Current Outpatient Prescriptions:  .  naproxen sodium (ANAPROX DS) 550 MG tablet, Take 1 tablet (550 mg total) by mouth 2 (two) times daily with a meal., Disp: 60 tablet, Rfl: 1  Review of Systems  Review of Systems  Constitutional: Negative for fever, chills, weight loss, malaise/fatigue and diaphoresis.  HENT: Negative for hearing loss, ear pain, nosebleeds, congestion, sore  throat, neck pain, tinnitus and ear discharge.   Eyes: Negative for blurred vision, double vision, photophobia, pain, discharge and redness.  Respiratory: Negative for cough, hemoptysis, sputum production, shortness of breath, wheezing and stridor.   Cardiovascular: Negative for chest pain, palpitations, orthopnea, claudication, leg swelling and PND.  Gastrointestinal: negative for abdominal pain. Negative for heartburn, nausea, vomiting, diarrhea, constipation, blood in stool and melena.  Genitourinary: Negative for dysuria, urgency, frequency, hematuria and flank pain.  Musculoskeletal: Negative for myalgias, back pain, joint pain and falls.  Skin: Negative for itching and rash.  Neurological: Negative for dizziness, tingling, tremors, sensory change, speech change, focal weakness, seizures, loss of consciousness, weakness and headaches.  Endo/Heme/Allergies: Negative for environmental allergies and polydipsia. Does not bruise/bleed easily.  Psychiatric/Behavioral: Negative for depression, suicidal ideas, hallucinations, memory loss and substance abuse. The patient is not nervous/anxious and does not have insomnia.        Objective:  Blood pressure 120/80, pulse 72, height 5\' 7"  (1.702 m), weight 194 lb (88 kg), last menstrual period 03/17/2016.   Physical Exam  Vitals reviewed. Constitutional: She is oriented to person, place, and time. She appears well-developed and well-nourished.  HENT:  Head: Normocephalic and atraumatic.        Right Ear: External ear normal.  Left Ear: External ear normal.  Nose: Nose normal.  Mouth/Throat: Oropharynx is clear and moist.  Eyes: Conjunctivae and EOM are normal. Pupils are equal, round, and reactive to light. Right eye exhibits no discharge. Left eye exhibits no discharge. No scleral icterus.  Neck: Normal range of motion. Neck supple. No tracheal deviation present. No thyromegaly present.  Cardiovascular: Normal rate, regular rhythm, normal heart  sounds and intact distal pulses.  Exam reveals no gallop and no friction rub.   No murmur heard. Respiratory: Effort normal and breath sounds normal. No respiratory distress. She has no wheezes. She has no rales. She exhibits no tenderness.  GI: Soft. Bowel sounds are normal. She exhibits no distension and no mass. There is no tenderness. There is no rebound and no guarding.  Genitourinary:  Breasts no masses skin changes or nipple changes bilaterally      Vulva is normal without lesions Vagina is pink moist without discharge Cervix normal in appearance and pap is done Uterus is normal size shape and contour Adnexa is negative with normal sized ovaries   Musculoskeletal: Normal range of motion. She exhibits no edema and no tenderness.  Neurological: She is alert and oriented to person, place, and time. She has normal reflexes. She displays normal reflexes. No cranial nerve deficit. She exhibits normal muscle tone. Coordination normal.  Skin: Skin is warm and dry. No rash noted. No erythema. No pallor.  Psychiatric: She has a normal mood and affect. Her behavior is normal. Judgment and thought content normal.       Medications Ordered at today's visit: Meds ordered this encounter  Medications  . naproxen sodium (ANAPROX DS) 550 MG tablet    Sig: Take 1 tablet (550 mg total) by mouth 2 (two) times daily with a meal.    Dispense:  60 tablet    Refill:  1    Other orders placed at today's visit: No orders of the defined types were placed in this encounter.     Assessment:    Healthy female exam.    Plan:    Follow up in: 1 year. anaprox ds for menses, mirena if periods remain crampy     Return in about 1 year (around 03/31/2017) for yearly, with Dr Elonda Husky.

## 2016-04-02 LAB — CYTOLOGY - PAP
DIAGNOSIS: NEGATIVE
HPV (WINDOPATH): NOT DETECTED

## 2016-04-10 ENCOUNTER — Ambulatory Visit (INDEPENDENT_AMBULATORY_CARE_PROVIDER_SITE_OTHER): Payer: BLUE CROSS/BLUE SHIELD | Admitting: *Deleted

## 2016-04-10 DIAGNOSIS — Z23 Encounter for immunization: Secondary | ICD-10-CM

## 2016-04-21 IMAGING — US US TRANSVAGINAL NON-OB
1 series · 13 of 25 positions shown · non-contrast
Comparison: None

CLINICAL DATA: Acute on chronic vaginal bleeding; history of
uterine fibroids; anemia ; the patient is currently bleeding.

EXAM:
TRANSABDOMINAL AND TRANSVAGINAL ULTRASOUND OF PELVIS
TECHNIQUE: Both transabdominal and transvaginal ultrasound examinations of the
pelvis were performed. Transabdominal technique was performed for
global imaging of the pelvis including uterus, ovaries, adnexal
regions, and pelvic cul-de-sac. It was necessary to proceed with
endovaginal exam following the transabdominal exam to visualize the
uterus and adnexal structures.

[Series 1: us transvaginal non-ob · 0.27mm/px · 13 of 94 slices shown]
[im 1/94]
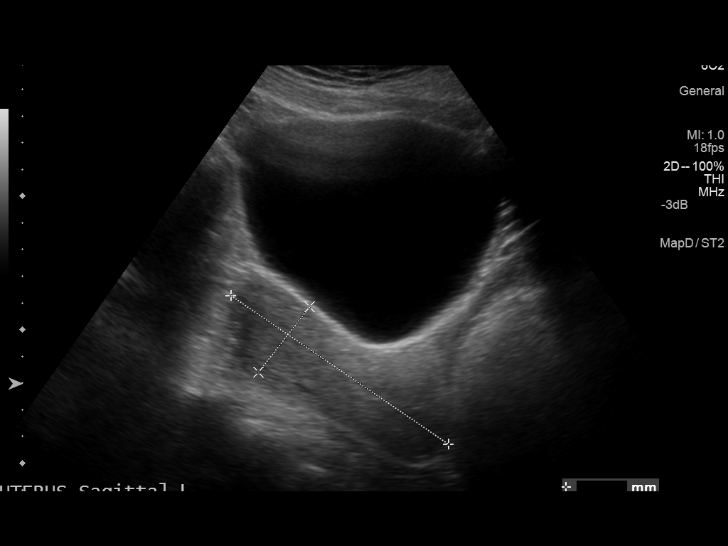
[im 8/94]
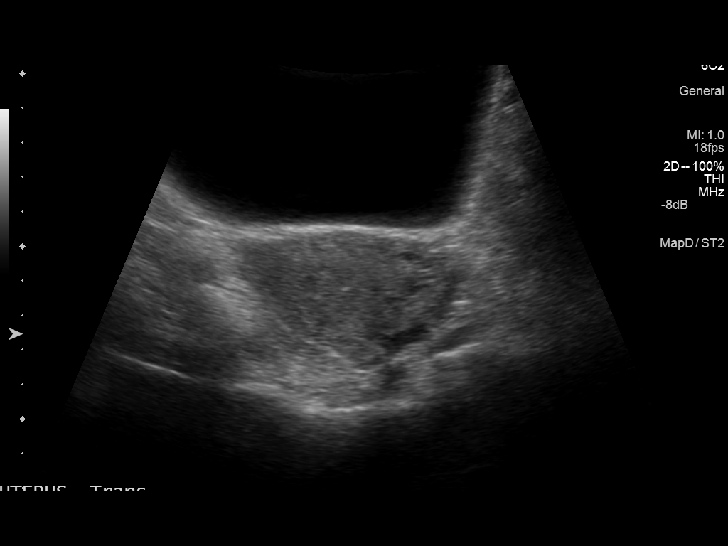
[im 16/94]
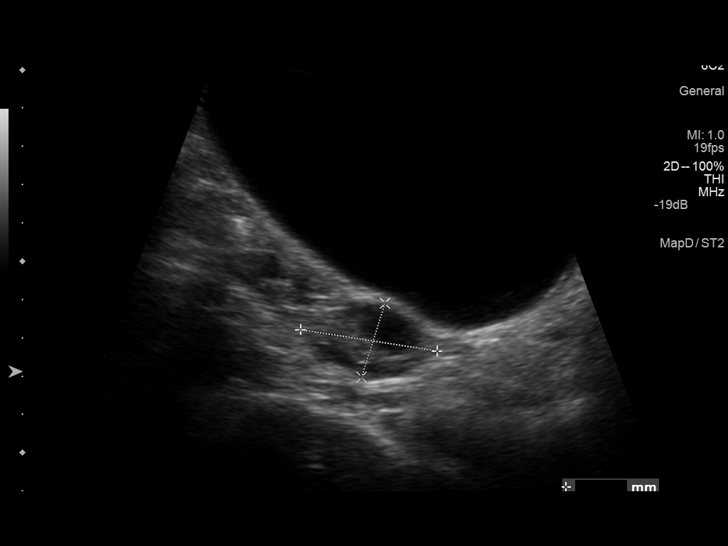
[im 24/94]
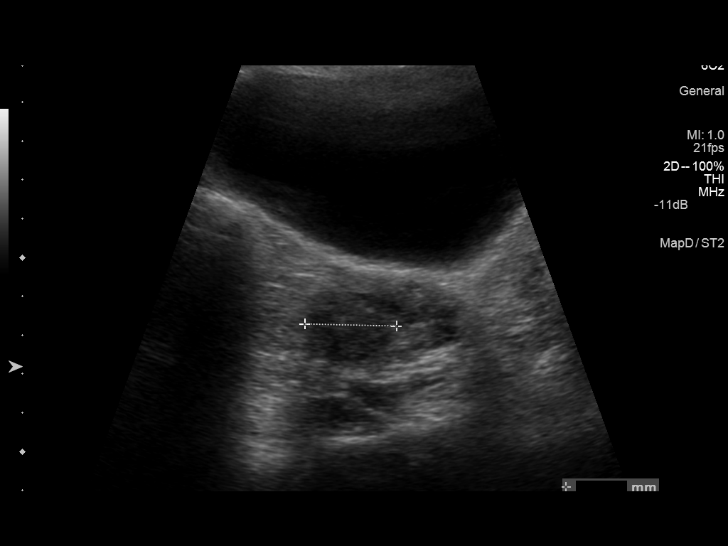
[im 32/94]
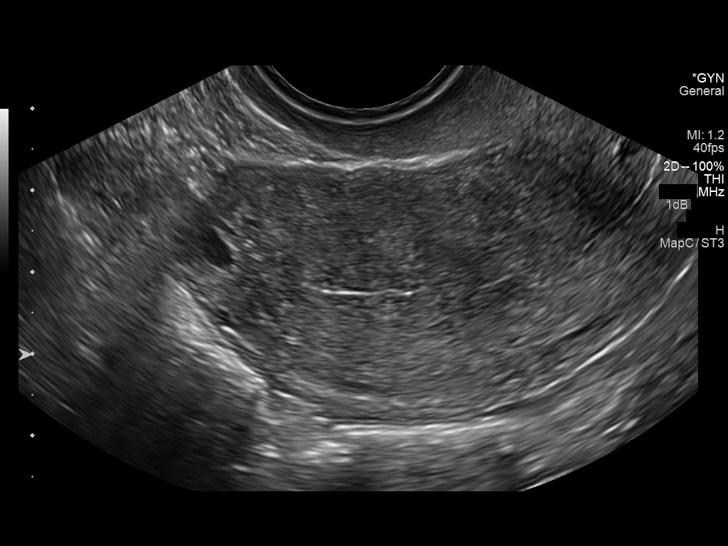
[im 39/94]
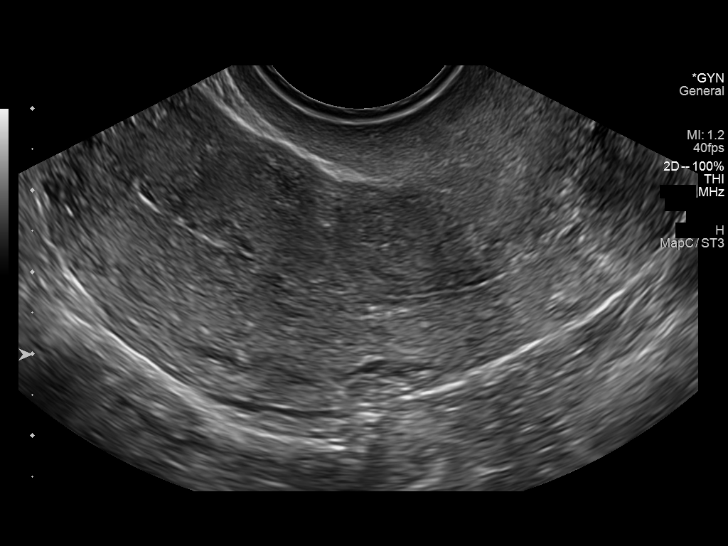
[im 47/94]
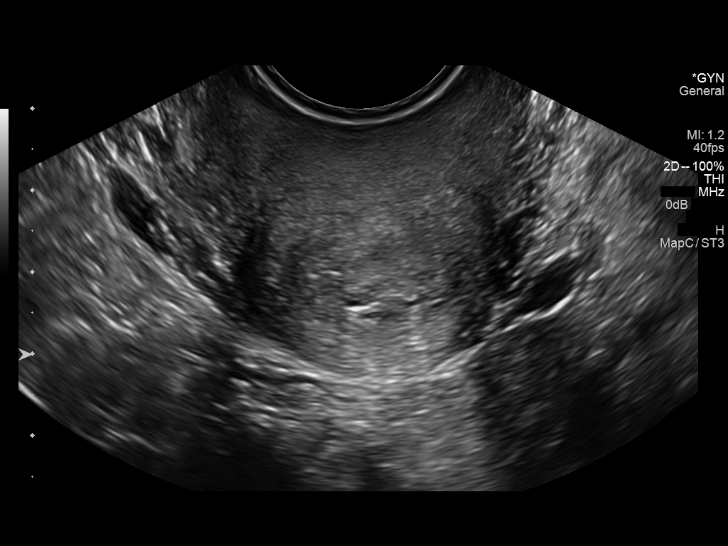
[im 55/94]
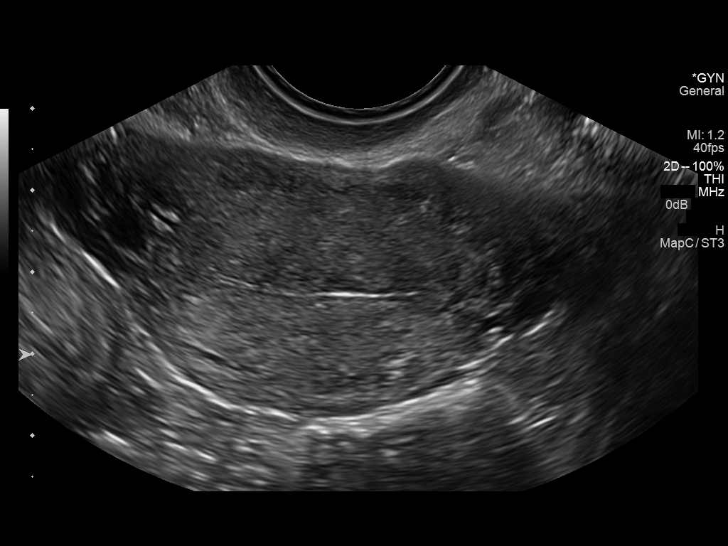
[im 63/94]
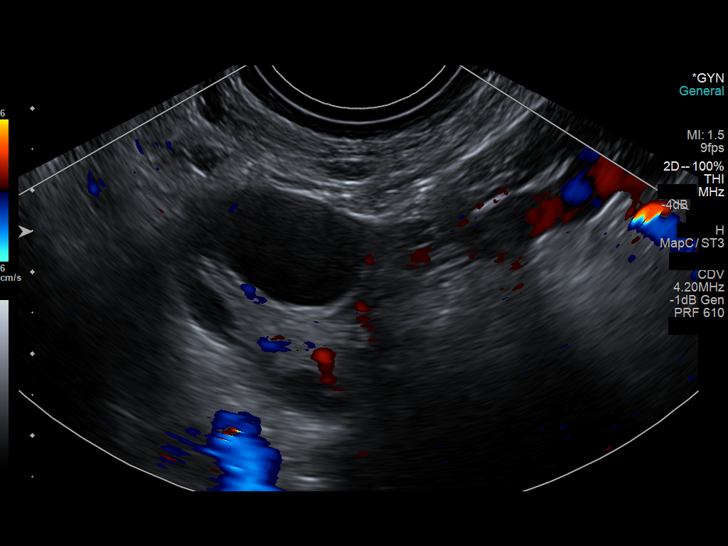
[im 70/94]
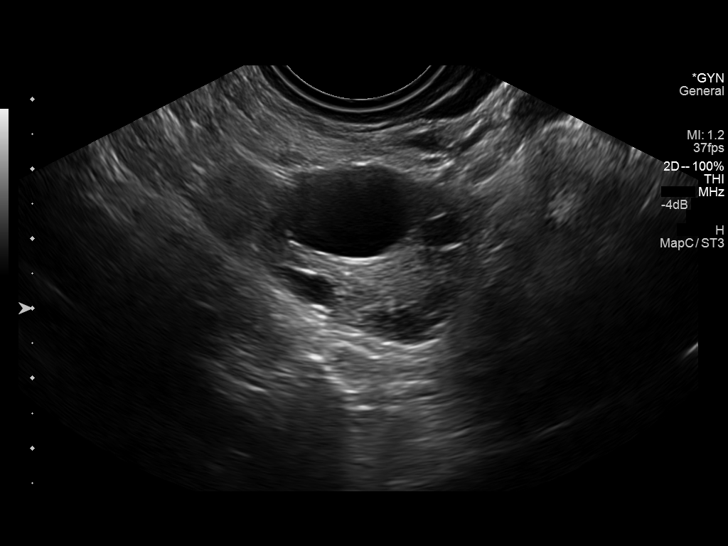
[im 78/94]
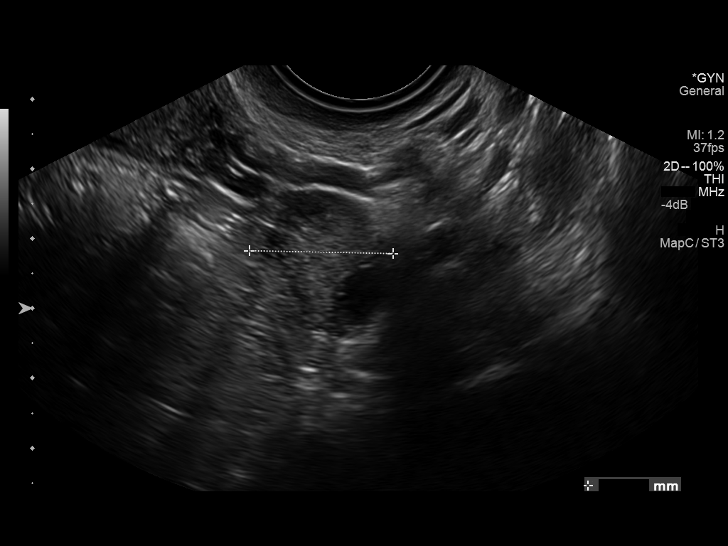
[im 86/94]
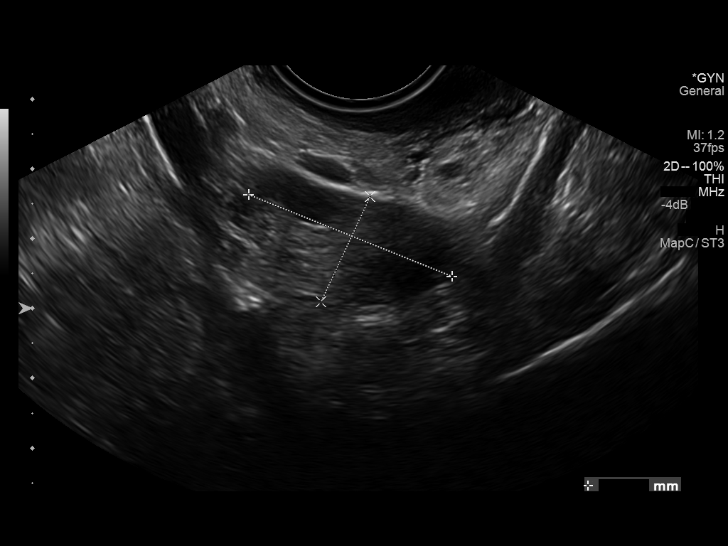
[im 94/94]
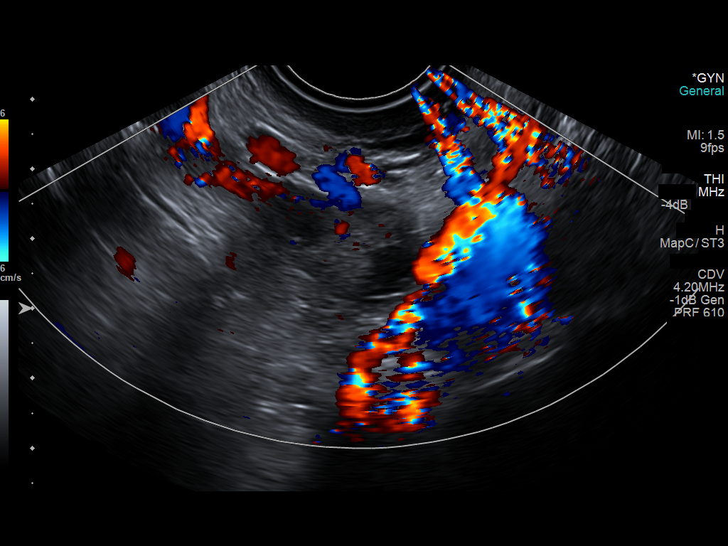

[13 of 25 positions shown; findings below may reference images not displayed]

FINDINGS: Uterus

Measurements: 9.9 x 3.1 x 6.1 cm.. No fibroids or other mass
visualized.

Endometrium

Thickness: 2.2 cm.  No focal abnormality visualized.

Right ovary

Measurements: 3.1 x 2.4 x 2.6 cm. There is a simple appearing cyst
measuring 2.1 x 1.4 x 2 cm.

Left ovary

Measurements: 3.1 x 1.7 x 2.1 cm. Normal appearance/no adnexal mass.

Other findings

No free fluid.
IMPRESSION: 1. The uterus is normal in echotexture. No fibroids are
demonstrated. The endometrial stripe is normal in thickness. If
bleeding remains unresponsive to hormonal or medical therapy,
sonohysterogram should be considered for focal lesion work-up. (Ref:
Radiological Reasoning: Algorithmic Workup of Abnormal Vaginal
Bleeding with Endovaginal Sonography and Sonohysterography. AJR
3990; 191:S68-73)
2. There is a simple appearing cyst in the right ovary. The left
ovary is unremarkable.

## 2016-11-05 ENCOUNTER — Ambulatory Visit (INDEPENDENT_AMBULATORY_CARE_PROVIDER_SITE_OTHER): Payer: BLUE CROSS/BLUE SHIELD | Admitting: Family Medicine

## 2016-11-05 ENCOUNTER — Encounter: Payer: Self-pay | Admitting: Family Medicine

## 2016-11-05 VITALS — BP 120/76 | Ht 67.0 in | Wt 192.4 lb

## 2016-11-05 DIAGNOSIS — G44209 Tension-type headache, unspecified, not intractable: Secondary | ICD-10-CM | POA: Diagnosis not present

## 2016-11-05 DIAGNOSIS — R000 Tachycardia, unspecified: Secondary | ICD-10-CM | POA: Diagnosis not present

## 2016-11-05 NOTE — Progress Notes (Signed)
   Subjective:    Patient ID: [name], female    DOB: December 11, 1980, 36 y.o.   MRN: [id number] Patient arrives office for a very protracted discussion Anxiety  Presents for initial visit. Onset was 1 to 4 weeks ago.    She has been experiencing rapid heart rate. Accompanied by a slight uncomfortable sensation the chest. Does not occur with exertion. No associated nausea. Positive associated shortness of breath. On further history almost always occurs when she's driving with her daughter. Next  Patient*completed learners driver's education. She now has her learners permit.  Curiously the patient's father is refusing to take the child out for 6 months. He is too nervous about it. It is left up to the mother he describes herself as being very nervous person. Next  [place] she cannot ride on airplanes without taking medication at a time. Next  Patient also notes headache posterior nature deep ache extending into the neck. Generally not sharp. Alleviated by ibuprofen.   Patient states that she has been having anxiety when daughter drives.   Patient's daughter just got her learners permit,     and anxiety starts while in the car.  Pt present with rapid heart rate    Review of Systems No headache, no major weight loss or weight gain, no chest pain no back pain abdominal pain no change in bowel habits complete ROS otherwise negative     Objective:   Physical Exam Alert and oriented, vitals reviewed and stable, NAD ENT-TM's and ext canals WNL bilat via otoscopic exam Soft palate, tonsils and post pharynx WNL via oropharyngeal exam Neck-symmetric, no masses; thyroid nonpalpable and nontender Pulmonary-no tachypnea or accessory muscle use; Clear without wheezes via auscultation Card--no abnrml murmurs, rhythm reg and rate WNL Carotid pulses symmetric, without bruits        Assessment & Plan:  Impression headaches likely tension nature discussed at length #2  tachyarrhythmias likely sinus tachycardia. Likely anxiety associated. No need for cardiology workup patient was wondering about this discussed at length #3 curiously patient brings in her daughter today. We have a long discussion. We all decided it might be better if the daughter gets a bit more education since the mother is concerned she isn't driving as good as she should be at this time. Would recommend family spend a lot of money and get another round the driver's education. Both mother and daughter seemed amenable to this.  Greater than 50% of this 25 minute face to face visit was spent in counseling and discussion and coordination of care regarding the above diagnosis/diagnosies

## 2016-11-18 DIAGNOSIS — F411 Generalized anxiety disorder: Secondary | ICD-10-CM | POA: Diagnosis not present

## 2016-12-22 DIAGNOSIS — F411 Generalized anxiety disorder: Secondary | ICD-10-CM | POA: Diagnosis not present

## 2016-12-23 ENCOUNTER — Ambulatory Visit: Payer: Managed Care, Other (non HMO) | Admitting: Family Medicine

## 2017-07-27 DIAGNOSIS — F411 Generalized anxiety disorder: Secondary | ICD-10-CM | POA: Diagnosis not present

## 2017-07-27 DIAGNOSIS — Z23 Encounter for immunization: Secondary | ICD-10-CM | POA: Diagnosis not present

## 2018-01-25 DIAGNOSIS — F411 Generalized anxiety disorder: Secondary | ICD-10-CM | POA: Diagnosis not present

## 2018-03-04 ENCOUNTER — Ambulatory Visit (INDEPENDENT_AMBULATORY_CARE_PROVIDER_SITE_OTHER): Payer: BLUE CROSS/BLUE SHIELD | Admitting: Advanced Practice Midwife

## 2018-03-04 ENCOUNTER — Other Ambulatory Visit: Payer: Self-pay

## 2018-03-04 ENCOUNTER — Encounter: Payer: Self-pay | Admitting: Advanced Practice Midwife

## 2018-03-04 VITALS — BP 135/79 | HR 82 | Ht 66.0 in | Wt 193.0 lb

## 2018-03-04 DIAGNOSIS — Z01419 Encounter for gynecological examination (general) (routine) without abnormal findings: Secondary | ICD-10-CM | POA: Diagnosis not present

## 2018-03-04 NOTE — Progress Notes (Signed)
[name] 37 y.o.  Vitals:   03/04/18 1008  BP: 135/79  Pulse: 82     Filed Weights   03/04/18 1008  Weight: 193 lb (87.5 kg)    Past Medical History: Past Medical History:  Diagnosis Date  . Anemia   . Uterine fibroid     Past Surgical History: Past Surgical History:  Procedure Laterality Date  . DILITATION & CURRETTAGE/HYSTROSCOPY WITH THERMACHOICE ABLATION N/A 05/01/2014   Procedure: DILATATION & CURETTAGE/HYSTEROSCOPY WITH THERMACHOICE ABLATION;  Surgeon: Florian Buff, MD;  Location: [facility];  Service: Gynecology;  Laterality: N/A;  total therapy time: 67min, 46 sec temp 87 degree Celsious D5W 62ml in 3ml out  . TUBAL LIGATION      Family History: Family History  Problem Relation Age of Onset  . Anuerysm Maternal Grandmother   . Hyperlipidemia Mother   . Other Mother        overweight  . Hyperlipidemia Father   . Other Father        overweight    Social History: Social History   Tobacco Use  . Smoking status: Never Smoker  . Smokeless tobacco: Never Used  Substance Use Topics  . Alcohol use: No    Alcohol/week: 0.0 standard drinks  . Drug use: No    Allergies:  Allergies  Allergen Reactions  . Other     Cat dander      Current Outpatient Medications:  .  acetaminophen (TYLENOL) 325 MG tablet, Take 325 mg by mouth every 6 (six) hours as needed., Disp: , Rfl:  .  LORazepam (ATIVAN) 0.5 MG tablet, Take 0.5 mg by mouth every 6 (six) hours as needed for anxiety., Disp: , Rfl:  .  naproxen sodium (ANAPROX) 220 MG tablet, Take 220 mg by mouth 2 (two) times daily with a meal., Disp: , Rfl:   History of Present Illness: Here for annual exam.  Had endo ablation 4 years ago,  Bleeds every few months, not as heavy as before the surgery.  Has had a few instances of "feelng like my vagina is turning inside out" which is intense, dwindles over the next day or so.  Unsure of whether it is cyclical because she doesn't bleed every month. Last pap 2017,  normal.     Review of Systems   Patient denies any headaches, blurred vision, shortness of breath, chest pain, abdominal pain, problems with bowel movements, urination, or intercourse.   Physical Exam: General:  Well developed, well nourished, no acute distress Skin:  Warm and dry Neck:  Midline trachea, normal thyroid Lungs; Clear to auscultation bilaterally Breast:  No dominant palpable mass, retraction, or nipple discharge Cardiovascular: Regular rate and rhythm Abdomen:  Soft, non tender, no hepatosplenomegaly Pelvic:  External genitalia is normal in appearance.  The vagina is normal in appearance.  The cervix is bulbous.  Uterus is felt to be normal size, shape, and contour.  No adnexal masses or tenderness noted. When doing bimanual, she states that the pains she has had is very similar to when I am in her ovary region.  Extremities:  No swelling or varicosities noted Psych:  No mood changes.     Impression: Normal GYN exam occ pain ,seems to origiante in ovary region, ? [place]merz?      Plan: pap next October Consider pelvic US (pt will let me know if she wants to proceed) Track pain/bleeding to see if pain could be ovulation pain.

## 2018-04-06 DIAGNOSIS — Z23 Encounter for immunization: Secondary | ICD-10-CM | POA: Diagnosis not present

## 2018-07-28 DIAGNOSIS — R7303 Prediabetes: Secondary | ICD-10-CM | POA: Diagnosis not present

## 2018-07-28 DIAGNOSIS — F411 Generalized anxiety disorder: Secondary | ICD-10-CM | POA: Diagnosis not present

## 2019-01-26 DIAGNOSIS — F411 Generalized anxiety disorder: Secondary | ICD-10-CM | POA: Diagnosis not present

## 2019-01-26 DIAGNOSIS — R32 Unspecified urinary incontinence: Secondary | ICD-10-CM | POA: Diagnosis not present

## 2019-07-05 DIAGNOSIS — F411 Generalized anxiety disorder: Secondary | ICD-10-CM | POA: Diagnosis not present

## 2019-07-05 DIAGNOSIS — R7303 Prediabetes: Secondary | ICD-10-CM | POA: Diagnosis not present

## 2019-07-05 DIAGNOSIS — R32 Unspecified urinary incontinence: Secondary | ICD-10-CM | POA: Diagnosis not present

## 2019-07-11 DIAGNOSIS — R7303 Prediabetes: Secondary | ICD-10-CM | POA: Diagnosis not present

## 2019-07-11 DIAGNOSIS — R7309 Other abnormal glucose: Secondary | ICD-10-CM | POA: Diagnosis not present

## 2019-07-11 DIAGNOSIS — Z13220 Encounter for screening for lipoid disorders: Secondary | ICD-10-CM | POA: Diagnosis not present

## 2020-01-09 DIAGNOSIS — R32 Unspecified urinary incontinence: Secondary | ICD-10-CM | POA: Diagnosis not present

## 2020-01-09 DIAGNOSIS — F411 Generalized anxiety disorder: Secondary | ICD-10-CM | POA: Diagnosis not present

## 2020-02-10 DIAGNOSIS — E6609 Other obesity due to excess calories: Secondary | ICD-10-CM | POA: Diagnosis not present

## 2020-02-10 DIAGNOSIS — F411 Generalized anxiety disorder: Secondary | ICD-10-CM | POA: Diagnosis not present

## 2020-07-30 DIAGNOSIS — R32 Unspecified urinary incontinence: Secondary | ICD-10-CM | POA: Diagnosis not present

## 2020-07-30 DIAGNOSIS — F411 Generalized anxiety disorder: Secondary | ICD-10-CM | POA: Diagnosis not present

## 2020-08-10 ENCOUNTER — Emergency Department (HOSPITAL_COMMUNITY): Payer: BC Managed Care – PPO

## 2020-08-10 ENCOUNTER — Other Ambulatory Visit: Payer: Self-pay

## 2020-08-10 ENCOUNTER — Emergency Department (HOSPITAL_COMMUNITY)
Admission: EM | Admit: 2020-08-10 | Discharge: 2020-08-10 | Disposition: A | Discharge status: Home / Self Care | Payer: BC Managed Care – PPO | Attending: Emergency Medicine | Admitting: Emergency Medicine

## 2020-08-10 ENCOUNTER — Encounter (HOSPITAL_COMMUNITY): Payer: Self-pay | Admitting: *Deleted

## 2020-08-10 DIAGNOSIS — R1011 Right upper quadrant pain: Secondary | ICD-10-CM

## 2020-08-10 DIAGNOSIS — R0602 Shortness of breath: Secondary | ICD-10-CM | POA: Diagnosis not present

## 2020-08-10 DIAGNOSIS — R079 Chest pain, unspecified: Secondary | ICD-10-CM | POA: Diagnosis not present

## 2020-08-10 DIAGNOSIS — K760 Fatty (change of) liver, not elsewhere classified: Secondary | ICD-10-CM | POA: Diagnosis not present

## 2020-08-10 DIAGNOSIS — R0789 Other chest pain: Secondary | ICD-10-CM

## 2020-08-10 DIAGNOSIS — R748 Abnormal levels of other serum enzymes: Secondary | ICD-10-CM | POA: Diagnosis not present

## 2020-08-10 DIAGNOSIS — R42 Dizziness and giddiness: Secondary | ICD-10-CM | POA: Diagnosis not present

## 2020-08-10 DIAGNOSIS — R7401 Elevation of levels of liver transaminase levels: Secondary | ICD-10-CM | POA: Insufficient documentation

## 2020-08-10 LAB — TROPONIN I (HIGH SENSITIVITY)
Troponin I (High Sensitivity): <2 ng/L (ref <18)
Troponin I (High Sensitivity): 2 ng/L (ref <18)

## 2020-08-10 LAB — BASIC METABOLIC PANEL
Anion gap: 11 (ref 5–15)
BUN: 13 mg/dL (ref 6–20)
CO2: 26 mmol/L (ref 22–32)
Calcium: 9.2 mg/dL (ref 8.9–10.3)
Chloride: 100 mmol/L (ref 98–111)
Creatinine, Ser: 0.62 mg/dL (ref 0.44–1.00)
GFR, Estimated: >60 mL/min (ref >60)
Glucose, Bld: 112 mg/dL — ABNORMAL HIGH (ref 70–99)
Potassium: 3.4 mmol/L — ABNORMAL LOW (ref 3.5–5.1)
Sodium: 137 mmol/L (ref 135–145)

## 2020-08-10 LAB — CBC
HCT: 38.3 % (ref 36.0–46.0)
Hemoglobin: 12.2 g/dL (ref 12.0–15.0)
MCH: 24.6 pg — ABNORMAL LOW (ref 26.0–34.0)
MCHC: 31.9 g/dL (ref 30.0–36.0)
MCV: 77.2 fL — ABNORMAL LOW (ref 80.0–100.0)
Platelets: 307 10*3/uL (ref 150–400)
RBC: 4.96 MIL/uL (ref 3.87–5.11)
RDW: 13.7 % (ref 11.5–15.5)
WBC: 5.9 10*3/uL (ref 4.0–10.5)
nRBC: 0 % (ref 0.0–0.2)

## 2020-08-10 LAB — HEPATIC FUNCTION PANEL
ALT: 80 U/L — ABNORMAL HIGH (ref 0–44)
AST: 137 U/L — ABNORMAL HIGH (ref 15–41)
Albumin: 4.1 g/dL (ref 3.5–5.0)
Alkaline Phosphatase: 45 U/L (ref 38–126)
Bilirubin, Direct: 0.2 mg/dL (ref 0.0–0.2)
Indirect Bilirubin: 0.6 mg/dL (ref 0.3–0.9)
Total Bilirubin: 0.8 mg/dL (ref 0.3–1.2)
Total Protein: 7.3 g/dL (ref 6.5–8.1)

## 2020-08-10 LAB — D-DIMER, QUANTITATIVE: D-Dimer, Quant: 0.3 ug/mL-FEU (ref 0.00–0.50)

## 2020-08-10 LAB — LIPASE, BLOOD: Lipase: 29 U/L (ref 11–51)

## 2020-08-10 LAB — HCG, SERUM, QUALITATIVE: Preg, Serum: NEGATIVE

## 2020-08-10 MED ORDER — ONDANSETRON 4 MG PO TBDP: 4.0000 mg | ORAL_TABLET | Freq: Three times a day (TID) | ORAL | 0 refills | Status: AC | PRN

## 2020-08-10 MED ORDER — MORPHINE SULFATE (PF) 4 MG/ML IV SOLN
4.0000 mg | Freq: Once | INTRAVENOUS | Status: AC
Start: 2020-08-10 — End: 2020-08-10
  Administered 2020-08-10: 4 mg via INTRAVENOUS
  Filled 2020-08-10: qty 1

## 2020-08-10 MED ORDER — HYDROCODONE-ACETAMINOPHEN 5-325 MG PO TABS: 1.0000 | ORAL_TABLET | ORAL | 0 refills | Status: AC | PRN

## 2020-08-10 MED ORDER — ONDANSETRON HCL 4 MG/2ML IJ SOLN
4.0000 mg | Freq: Once | INTRAMUSCULAR | Status: AC
  Administered 2020-08-10: 4 mg via INTRAVENOUS
  Filled 2020-08-10: qty 2

## 2020-08-10 MED ORDER — HYDROCODONE-ACETAMINOPHEN 5-325 MG PO TABS: 1.0000 | ORAL_TABLET | ORAL | 0 refills | Status: DC | PRN

## 2020-08-10 MED ORDER — FAMOTIDINE 20 MG PO TABS
20.0000 mg | ORAL_TABLET | Freq: Once | ORAL | Status: AC
  Administered 2020-08-10: 20 mg via ORAL
  Filled 2020-08-10: qty 1

## 2020-08-10 NOTE — ED Provider Notes (Signed)
[facility] Provider Note   CSN: [id number] Arrival date & time: 08/10/20  1227     History Chief Complaint  Patient presents with  . Chest Pain    [name] is a 40 y.o. female with a history of anemia and anxiety presenting for evaluation of chest pain which started around 11 am today.  She was sitting on a bench in her chicken coop providing care to them when she developed sudden onset of chest pain described as pressure which sharp pain that radiated into her left neck region, shortness of breath, also noted burning sensation in her epigastric region along with nausea without emesis.  She became very lightheaded but did not develop syncope.  She denies fevers or chills, cough, back pain.  Also denies lower extremity edema or pain.  She ate a spicy chicken sandwich about 30 minutes prior to onset of her symptoms.  She does have a history of anxiety, took 2 anxiety tablets prior to arrival with no improvement in her symptoms.  Denies prior similar symptoms.  She is found no alleviators for symptoms.  Her pain becomes worse when she is standing, better at rest although not completely resolved currently.  HPI     Past Medical History:  Diagnosis Date  . Anemia   . Uterine fibroid     Patient Active Problem List   Diagnosis Date Noted  . Anemia 04/30/2014  . Vaginal bleeding 04/30/2014  . Syncope 04/30/2014  . Absolute anemia   . Submucous leiomyoma of uterus 04/26/2014  . Episode of heavy vaginal bleeding 04/26/2014    Past Surgical History:  Procedure Laterality Date  . DILITATION & CURRETTAGE/HYSTROSCOPY WITH THERMACHOICE ABLATION N/A 05/01/2014   Procedure: DILATATION & CURETTAGE/HYSTEROSCOPY WITH THERMACHOICE ABLATION;  Surgeon: Florian Buff, MD;  Location: [facility];  Service: Gynecology;  Laterality: N/A;  total therapy time: 11min, 46 sec temp 87 degree Celsious D5W 63ml in 70ml out  . TUBAL LIGATION       OB History   No obstetric history  on file.     Family History  Problem Relation Age of Onset  . Anuerysm Maternal Grandmother   . Hyperlipidemia Mother   . Other Mother        overweight  . Hyperlipidemia Father   . Other Father        overweight    Social History   Tobacco Use  . Smoking status: Never Smoker  . Smokeless tobacco: Never Used  Substance Use Topics  . Alcohol use: No    Alcohol/week: 0.0 standard drinks  . Drug use: No    Home Medications Prior to Admission medications   Medication Sig Start Date End Date Taking? Authorizing Provider  acetaminophen (TYLENOL) 325 MG tablet Take 325 mg by mouth every 6 (six) hours as needed for mild pain, fever or headache.   Yes [provider]  HYDROcodone-acetaminophen (NORCO/VICODIN) 5-325 MG tablet Take 1 tablet by mouth every 4 (four) hours as needed for moderate pain. 08/10/20  Yes Idol, Almyra Free, PA-C  LORazepam (ATIVAN) 0.5 MG tablet Take 0.5-1 mg by mouth every 6 (six) hours as needed for anxiety.   Yes [provider]  ondansetron (ZOFRAN ODT) 4 MG disintegrating tablet Take 1 tablet (4 mg total) by mouth every 8 (eight) hours as needed for nausea or vomiting. 08/10/20  Yes Idol, Almyra Free, PA-C  oxybutynin (DITROPAN-XL) 10 MG 24 hr tablet Take 10 mg by mouth daily. 07/17/20  Yes [provider]  sertraline (ZOLOFT) 50 MG tablet Take 50 mg by mouth daily. 07/17/20  Yes [provider]    Allergies    Other  Review of Systems   Review of Systems  Constitutional: Negative for chills and fever.  HENT: Negative for congestion.   Eyes: Negative.   Respiratory: Positive for shortness of breath. Negative for chest tightness.   Cardiovascular: Positive for chest pain.  Gastrointestinal: Positive for abdominal pain and nausea. Negative for vomiting.  Genitourinary: Negative.   Musculoskeletal: Negative for arthralgias, joint swelling and neck pain.  Skin: Negative.  Negative for rash and wound.  Neurological: Positive for  light-headedness. Negative for dizziness, syncope, weakness, numbness and headaches.  Psychiatric/Behavioral: Negative.   All other systems reviewed and are negative.   Physical Exam Updated Vital Signs BP 138/82 (BP Location: Right Arm)   Pulse 85   Temp 98.1 F (36.7 C) (Oral)   Resp 14   Ht 5\' 6"  (1.676 m)   Wt 91.6 kg   SpO2 99%   BMI 32.60 kg/m   Physical Exam Vitals and nursing note reviewed.  Constitutional:      Appearance: She is well-developed.  HENT:     Head: Normocephalic and atraumatic.  Eyes:     Conjunctiva/sclera: Conjunctivae normal.  Cardiovascular:     Rate and Rhythm: Normal rate and regular rhythm.     Heart sounds: Normal heart sounds. No murmur heard.   Pulmonary:     Effort: Pulmonary effort is normal.     Breath sounds: Normal breath sounds. No wheezing or rhonchi.  Abdominal:     General: Bowel sounds are normal.     Palpations: Abdomen is soft.     Tenderness: There is abdominal tenderness in the right upper quadrant. There is no guarding. Negative signs include Murphy's sign.  Musculoskeletal:        General: Normal range of motion.     Cervical back: Normal range of motion.     Right lower leg: No tenderness. No edema.     Left lower leg: No edema.  Skin:    General: Skin is warm and dry.  Neurological:     Mental Status: She is alert.     ED Results / Procedures / Treatments   Labs (all labs ordered are listed, but only abnormal results are displayed) Labs Reviewed  BASIC METABOLIC PANEL - Abnormal; Notable for the following components:      Result Value   Potassium 3.4 (*)    Glucose, Bld 112 (*)    All other components within normal limits  CBC - Abnormal; Notable for the following components:   MCV 77.2 (*)    MCH 24.6 (*)    All other components within normal limits  HEPATIC FUNCTION PANEL - Abnormal; Notable for the following components:   AST 137 (*)    ALT 80 (*)    All other components within normal limits  HCG,  SERUM, QUALITATIVE  LIPASE, BLOOD  D-DIMER, QUANTITATIVE  TROPONIN I (HIGH SENSITIVITY)  TROPONIN I (HIGH SENSITIVITY)    EKG EKG Interpretation  Date/Time:  Friday August 10 2020 12:40:48 EST Ventricular Rate:  84 PR Interval:    QRS Duration: 79 QT Interval:  381 QTC Calculation: 451 R Axis:   59 Text Interpretation: Sinus rhythm ST elev, probable normal early repol pattern No significant change since last tracing Confirmed by Isla Pence [phone]) on 08/10/2020 5:44:51 PM   Radiology DG Chest Portable 1 View  Result  Date: 08/10/2020 CLINICAL DATA:  Chest pain since 11 a.m. EXAM: PORTABLE CHEST 1 VIEW COMPARISON:  None. FINDINGS: Trachea is midline. Heart at the upper limits of normal in size. Lungs are clear. Biapical pleural thickening. No pleural fluid. IMPRESSION: No acute findings. Electronically Signed   By: Lorin Picket M.D.   On: 08/10/2020 13:23   US Abdomen Limited RUQ (LIVER/GB)  Result Date: 08/10/2020 CLINICAL DATA:  Right upper quadrant pain x1 day. EXAM: ULTRASOUND ABDOMEN LIMITED RIGHT UPPER QUADRANT COMPARISON:  None. FINDINGS: Gallbladder: A 3.7 mm nonshadowing, nonmobile echogenic focus is seen along the wall of the gallbladder lumen. No gallstones or wall thickening visualized (3.2 mm). No sonographic Murphy sign noted by sonographer. Common bile duct: Diameter: 2.7 mm Liver: No focal lesion identified. Diffusely increased echogenicity of the liver parenchyma is seen. Portal vein is patent on color Doppler imaging with normal direction of blood flow towards the liver. Other: None. IMPRESSION: 1. Small gallbladder polyp without evidence of cholelithiasis or acute cholecystitis. 2. Fatty liver. Electronically Signed   By: Virgina Norfolk M.D.   On: 08/10/2020 17:24    Procedures Procedures   Medications Ordered in ED Medications  famotidine (PEPCID) tablet 20 mg (20 mg Oral Given 08/10/20 1421)  ondansetron (ZOFRAN) injection 4 mg (4 mg Intravenous Given  08/10/20 1422)  morphine 4 MG/ML injection 4 mg (4 mg Intravenous Given 08/10/20 1424)    ED Course  I have reviewed the triage vital signs and the nursing notes.  Pertinent labs & imaging results that were available during my care of the patient were reviewed by me and considered in my medical decision making (see chart for details).    MDM Rules/Calculators/A&P                           At re-exam,  Pt denies etoh use, no excessive tylenol use.   Pt with symptoms suggesting possible gallbladder dysfunction.  Elevated LFt's today with no US findings suggesting acute cholecystitis, no sludge or stones appreciated.  Small polyp.  Fatty liver, but doubt this as source of her acute sx with todays presentation.  Pt would benefit from HIDA scan.  Offered to order this as outpatient.  Pt will discuss with pcp and arrange through her.  Also given referral to GI.  Discussed avoiding fatty intake.    Delta trops negative, ekg stable, no evidence for ACS, doubt PE, normal range d dimer.   Final Clinical Impression(s) / ED Diagnoses Final diagnoses:  RUQ abdominal pain  Atypical chest pain  Elevated liver enzymes    Rx / DC Orders ED Discharge Orders         Ordered    HYDROcodone-acetaminophen (NORCO/VICODIN) 5-325 MG tablet  Every 4 hours PRN        08/10/20 1754    ondansetron (ZOFRAN ODT) 4 MG disintegrating tablet  Every 8 hours PRN        08/10/20 1754           Evalee Jefferson, PA-C 08/10/20 1801    Isla Pence, MD 08/10/20 2316

## 2020-08-10 NOTE — Discharge Instructions (Signed)
As discussed, your elevated liver enzymes and symptom presentation suggest a gallbladder problem such as gallbladder dysfunction.  You would benefit from 1 additional test called a HIDA scan which your primary doctor or GI specialist can arrange for you.  In the meantime I recommend avoid excessive intake of fatty foods as this can trigger another attack.  You may use the medications prescribed if needed for return of your nausea or pain.  The pain medicine will make you drowsy, do not drive within 4 hours of taking hydrocodone.  Your lab test today also gives [place] reassurance that your heart appears healthy, your EKG is normal, although your symptoms presented as chest pain this does not appear to be related to your heart or lungs.

## 2020-08-10 NOTE — ED Triage Notes (Signed)
Chest pain x 2 hours, took nerve pills without relief

## 2020-08-10 NOTE — ED Notes (Signed)
Pt verbalized understanding of no driving and to use caution within 4 hours of taking pain meds due to meds cause drowsiness 

## 2020-08-10 NOTE — ED Notes (Signed)
Patient transported to Ultrasound 

## 2020-08-10 NOTE — ED Notes (Signed)
EKG handed to Dr. Rogene Houston

## 2021-01-28 DIAGNOSIS — E6609 Other obesity due to excess calories: Secondary | ICD-10-CM | POA: Diagnosis not present

## 2021-01-28 DIAGNOSIS — R7303 Prediabetes: Secondary | ICD-10-CM | POA: Diagnosis not present

## 2021-01-28 DIAGNOSIS — F411 Generalized anxiety disorder: Secondary | ICD-10-CM | POA: Diagnosis not present

## 2021-01-28 DIAGNOSIS — R079 Chest pain, unspecified: Secondary | ICD-10-CM | POA: Diagnosis not present

## 2021-07-30 DIAGNOSIS — R32 Unspecified urinary incontinence: Secondary | ICD-10-CM | POA: Diagnosis not present

## 2021-07-30 DIAGNOSIS — F411 Generalized anxiety disorder: Secondary | ICD-10-CM | POA: Diagnosis not present

## 2022-01-28 DIAGNOSIS — F4321 Adjustment disorder with depressed mood: Secondary | ICD-10-CM | POA: Diagnosis not present

## 2022-01-28 DIAGNOSIS — R32 Unspecified urinary incontinence: Secondary | ICD-10-CM | POA: Diagnosis not present

## 2022-01-28 DIAGNOSIS — F411 Generalized anxiety disorder: Secondary | ICD-10-CM | POA: Diagnosis not present

## 2022-01-28 DIAGNOSIS — R7303 Prediabetes: Secondary | ICD-10-CM | POA: Diagnosis not present

## 2022-03-17 DIAGNOSIS — F411 Generalized anxiety disorder: Secondary | ICD-10-CM | POA: Diagnosis not present

## 2022-08-01 IMAGING — DX DG CHEST 1V PORT
1 series · 1 of 1 positions shown · non-contrast
Comparison: None.

CLINICAL DATA: Chest pain since 11 a.m.

EXAM:
PORTABLE CHEST 1 VIEW

[chest ap]
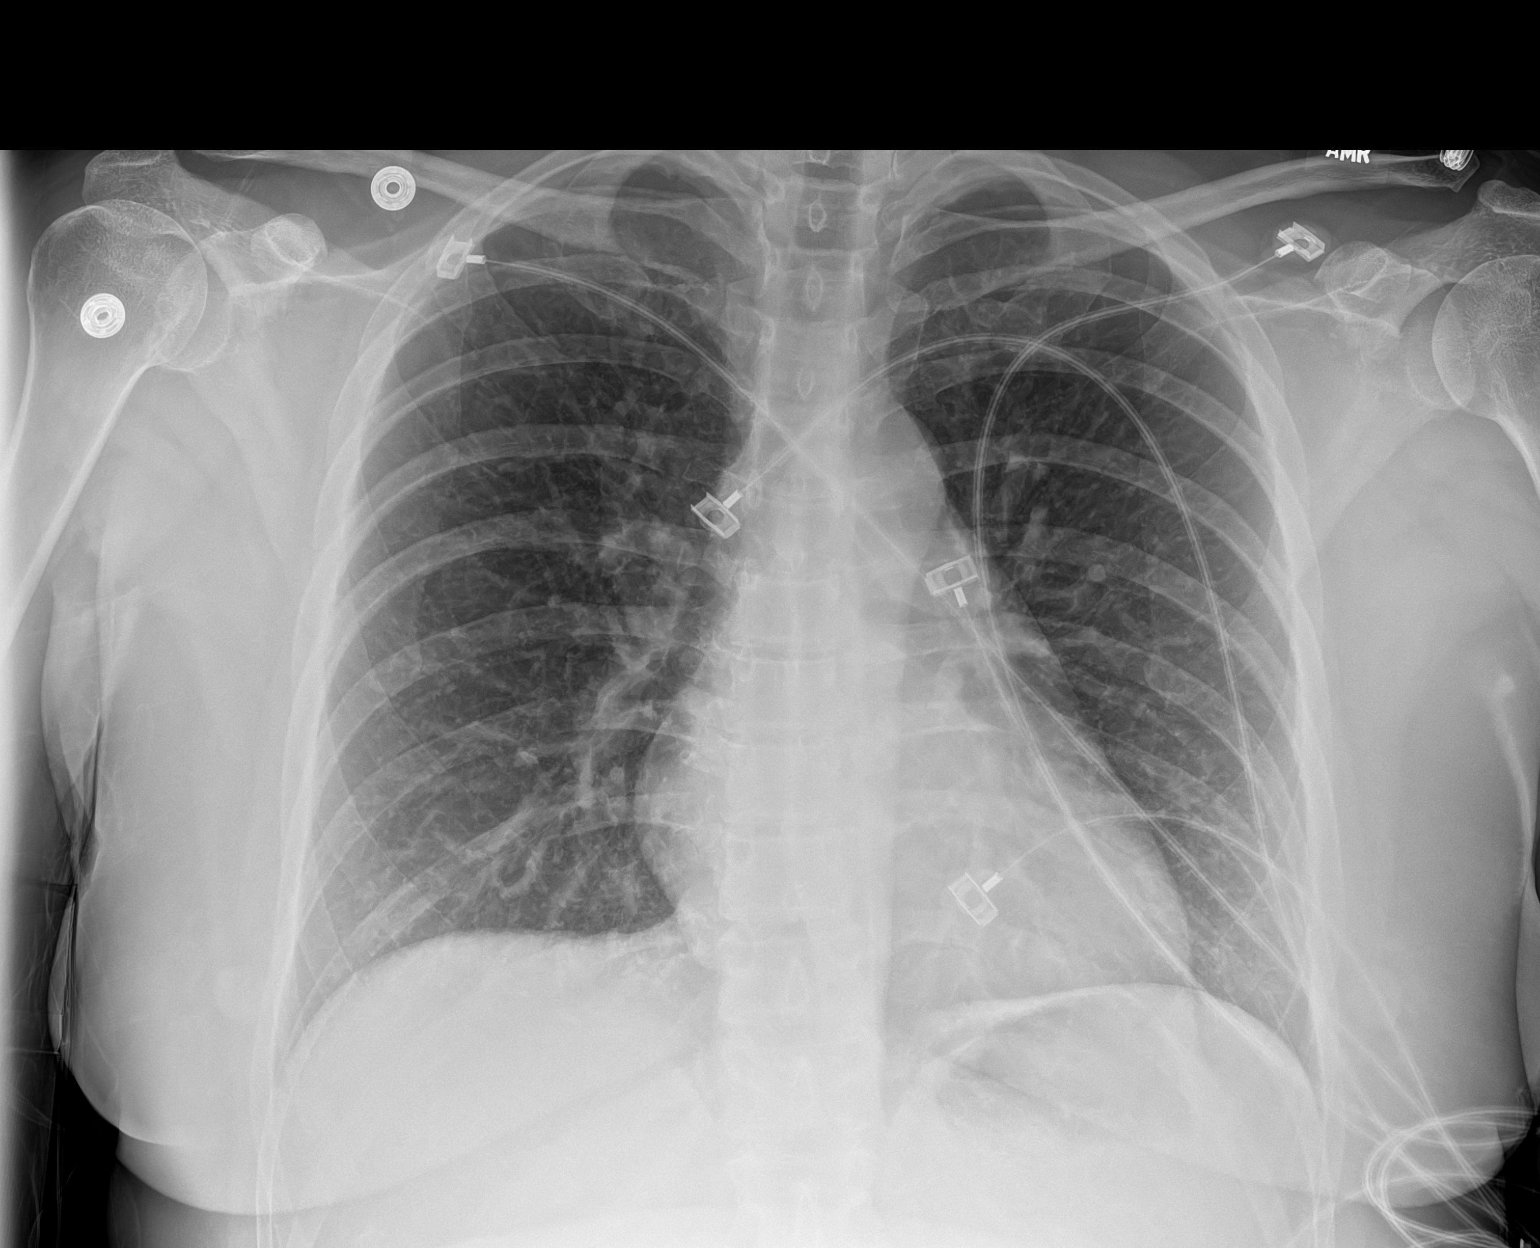

[1 of 1 positions shown; findings below may reference images not displayed]

FINDINGS: Trachea is midline. Heart at the upper limits of normal in size.
Lungs are clear. Biapical pleural thickening. No pleural fluid.
IMPRESSION: No acute findings.

## 2022-08-01 IMAGING — US US ABDOMEN LIMITED RUQ/ASCITES
1 series · 14 of 25 positions shown · non-contrast
Comparison: None.

CLINICAL DATA: Right upper quadrant pain x1 day.

EXAM:
ULTRASOUND ABDOMEN LIMITED RIGHT UPPER QUADRANT

[Series 1: us abdomen limited ruq (liver/gb) · 14 of 36 slices shown]
[im 1/36]
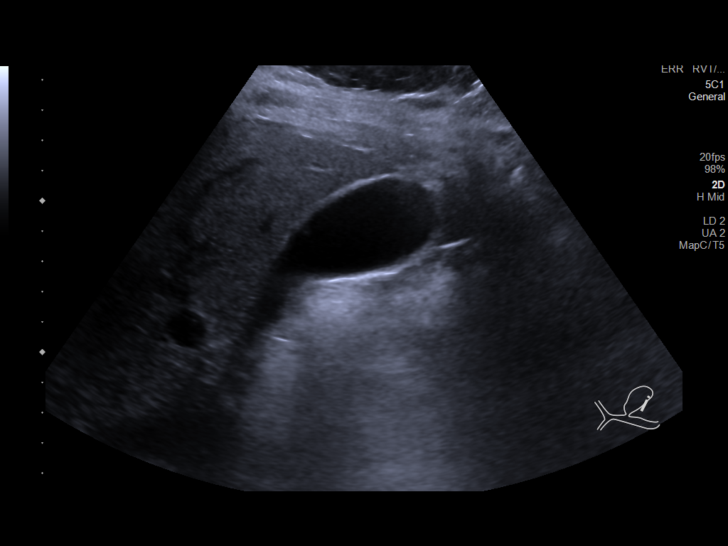
[im 3/36]
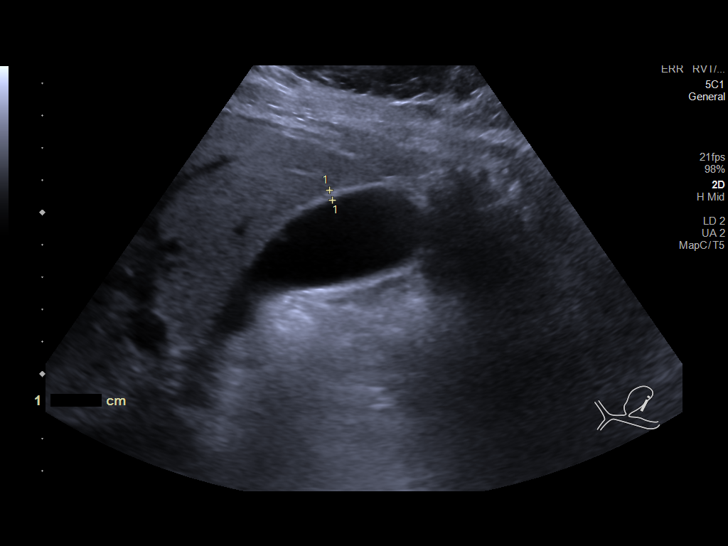
[im 6/36]
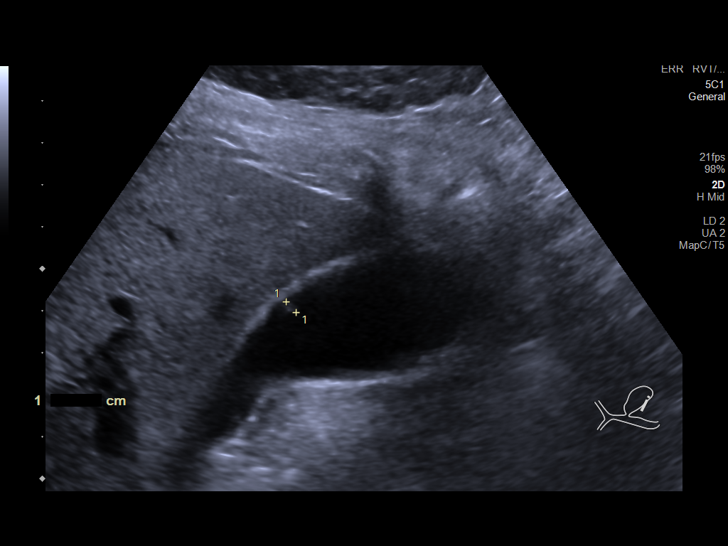
[im 9/36]
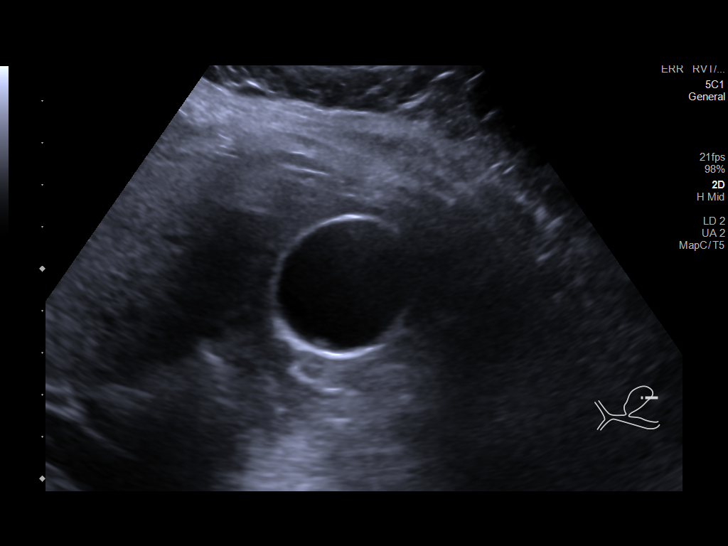
[im 12/36]
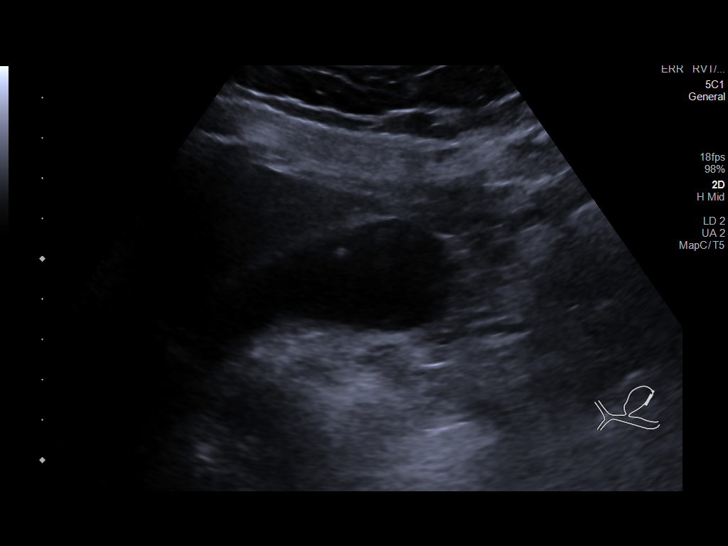
[im 14/36]
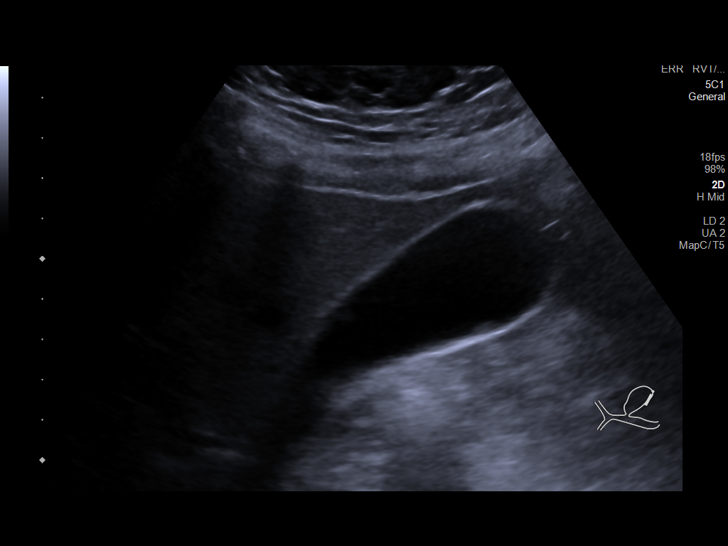
[im 17/36]
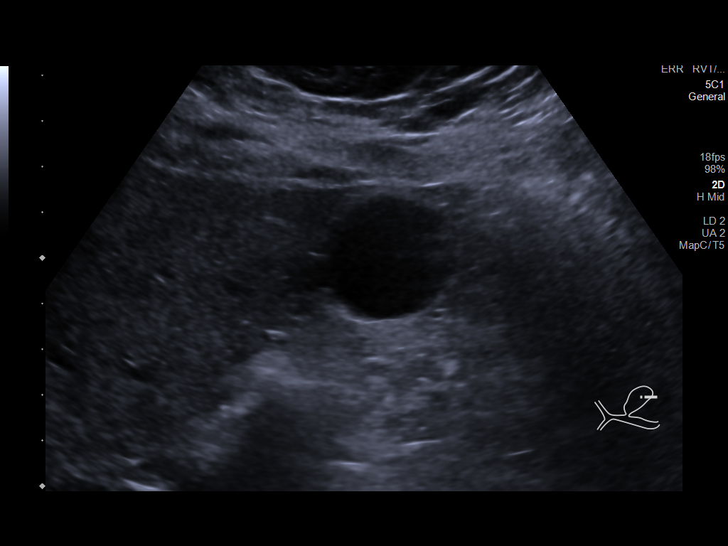
[im 19/36]
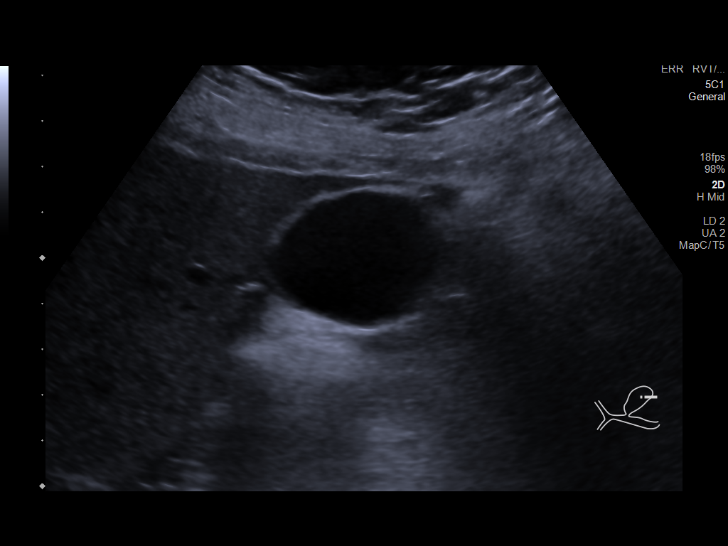
[im 22/36]
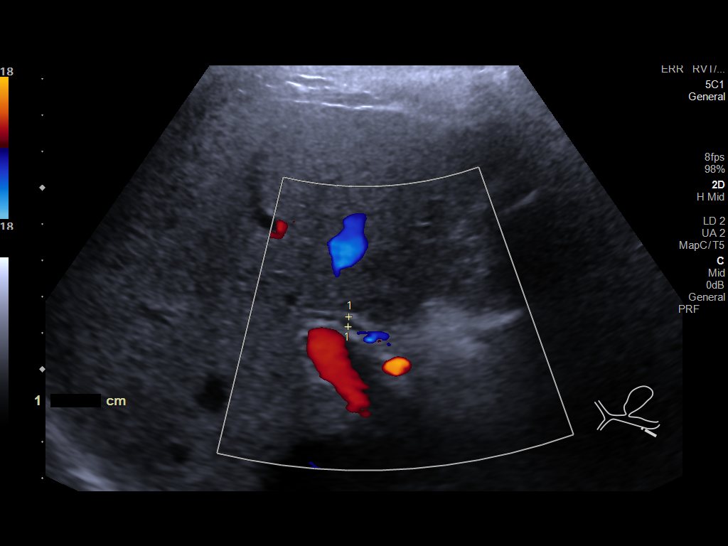
[im 24/36]
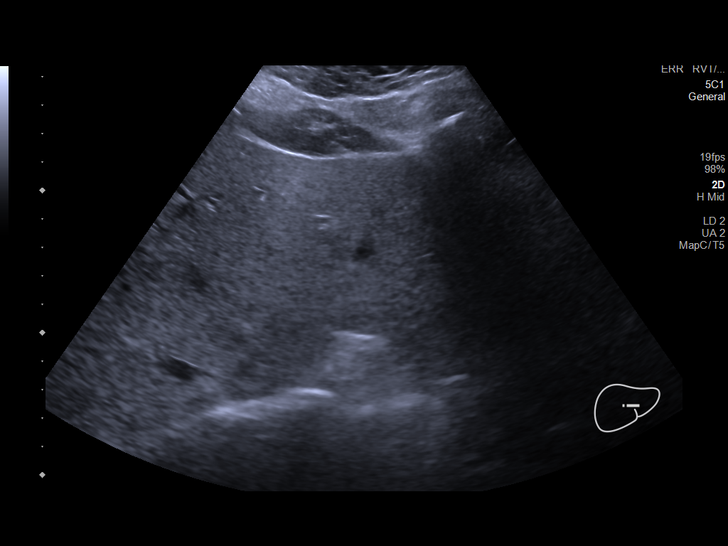
[im 27/36]
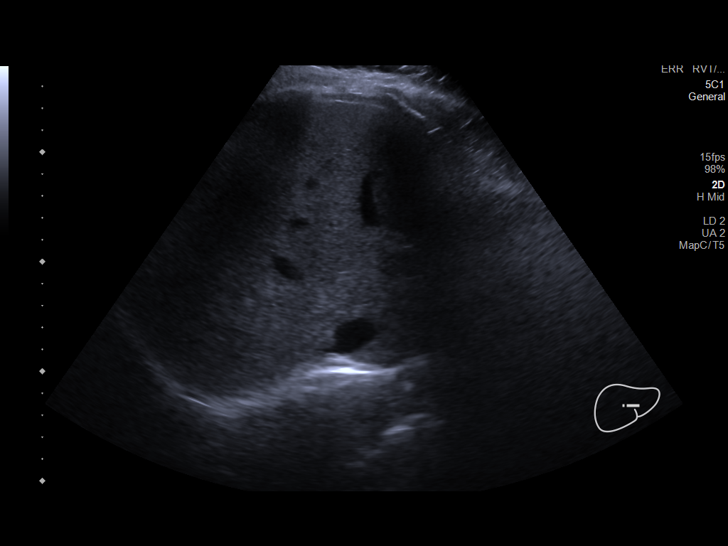
[im 30/36]
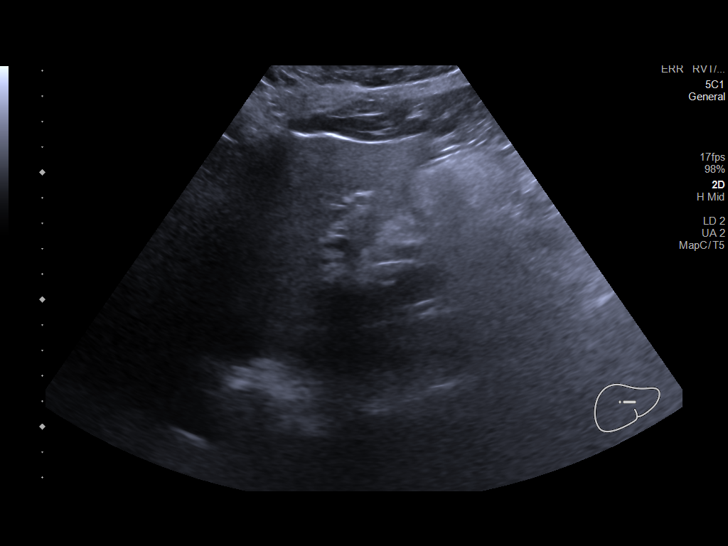
[im 33/36]
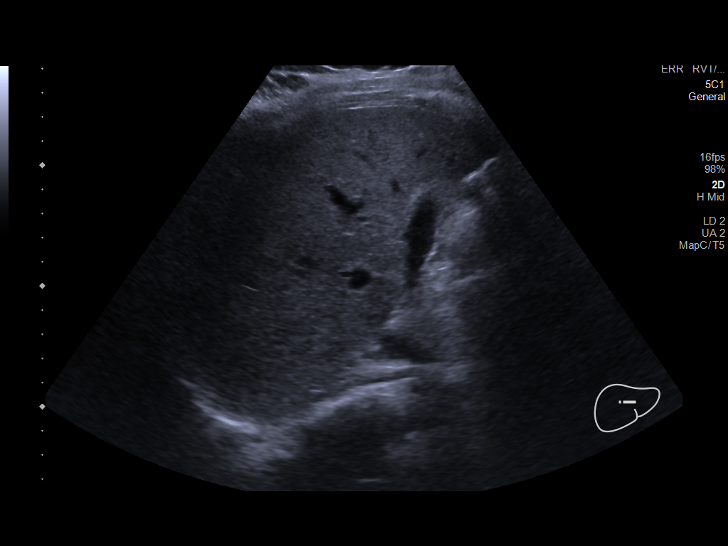
[im 36/36]
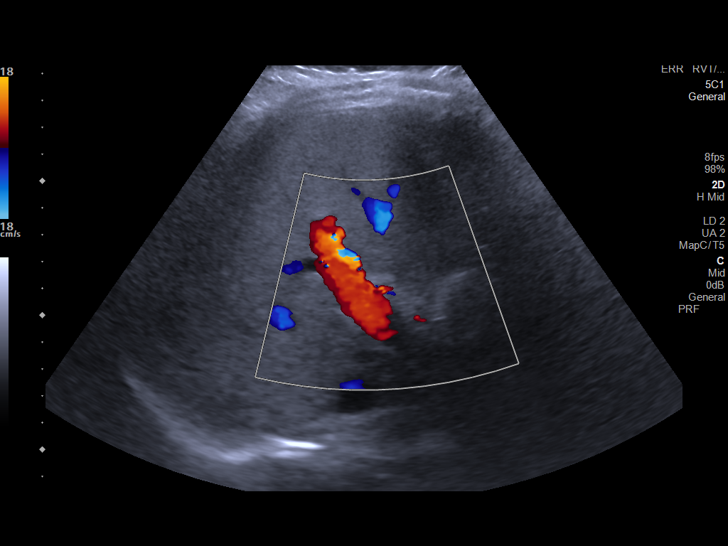

[14 of 25 positions shown; findings below may reference images not displayed]

FINDINGS: Gallbladder:

A 3.7 mm nonshadowing, nonmobile echogenic focus is seen along the
wall of the gallbladder lumen. No gallstones or wall thickening
visualized (3.2 mm). No sonographic Murphy sign noted by
sonographer.

Common bile duct:

Diameter: 2.7 mm

Liver:

No focal lesion identified. Diffusely increased echogenicity of the
liver parenchyma is seen. Portal vein is patent on color Doppler
imaging with normal direction of blood flow towards the liver.

Other: None.
IMPRESSION: 1. Small gallbladder polyp without evidence of cholelithiasis or
acute cholecystitis.
2. Fatty liver.
